# Patient Record
Sex: Female | Born: 1965 | Race: White | Hispanic: No | Marital: Married | Smoking: Former smoker
Health system: Southern US, Community
[De-identification: ages and names within clinical notes are randomized; demographics above are authoritative.]

## PROBLEM LIST (undated history)

## (undated) DIAGNOSIS — J45909 Unspecified asthma, uncomplicated: Secondary | ICD-10-CM

## (undated) DIAGNOSIS — Z973 Presence of spectacles and contact lenses: Secondary | ICD-10-CM

---

## 2001-07-24 HISTORY — PX: DILATION AND CURETTAGE OF UTERUS: SHX78

## 2007-02-07 ENCOUNTER — Ambulatory Visit: Payer: Self-pay | Admitting: Family Medicine

## 2009-10-03 ENCOUNTER — Emergency Department: Payer: Self-pay

## 2009-10-05 ENCOUNTER — Ambulatory Visit: Payer: Self-pay | Admitting: Urology

## 2009-10-07 ENCOUNTER — Ambulatory Visit: Payer: Self-pay | Admitting: Urology

## 2009-10-08 ENCOUNTER — Emergency Department: Payer: Self-pay | Admitting: Emergency Medicine

## 2011-04-05 IMAGING — CT CT ABD-PELV W/ CM
2 of 4 series · 13 of 32 positions shown, 18 images · non-contrast
Comparison: none

REASON FOR EXAM: (1) RLQ pain; (2) see above
COMMENTS:

PROCEDURE:     CT  - CT ABDOMEN / PELVIS  W  - October 03, 2009  [DATE]
RESULT:     CT abdomen and pelvis dated 10/03/2009
TECHNIQUE: Helical 3 mm sections were obtained from the lung bases through
the pubic symphysis status post intravenous administration of 100 mL
Bsovue-3IX and oral contrast. Included are immediate and 2 sets of delayed
imaging status post administration of intravenous contrast, [DATE] is a
10 minute delay.

[Series 2: appendicitis · axial · 0.69mm/px · z∈[-431,-59]mm · 8 of 160 slices shown, 13 images]
[im 18/160  soft-tissue]
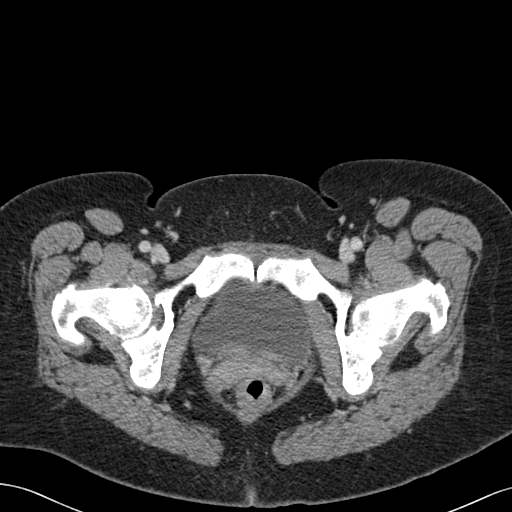
[im 18/160  bone]
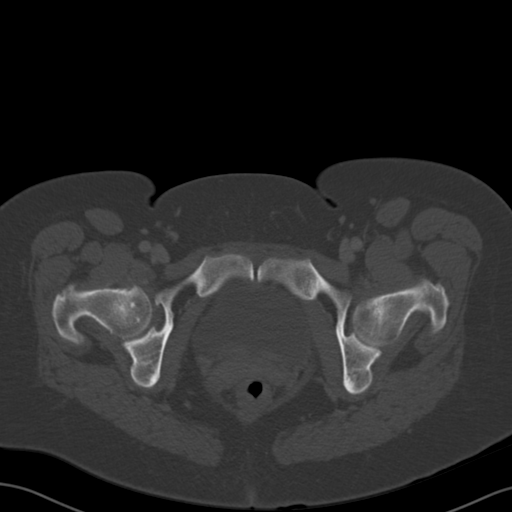
[im 36/160  soft-tissue]
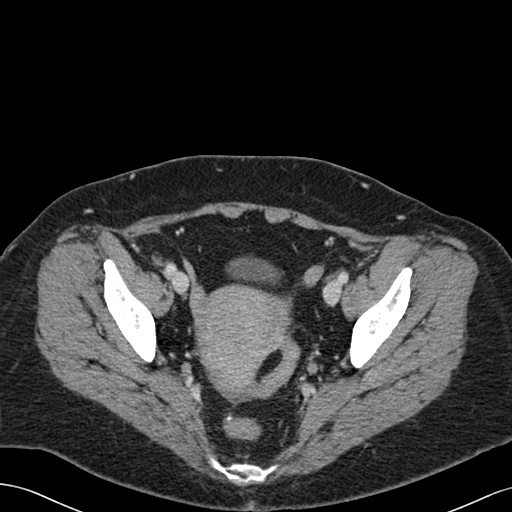
[im 54/160  soft-tissue]
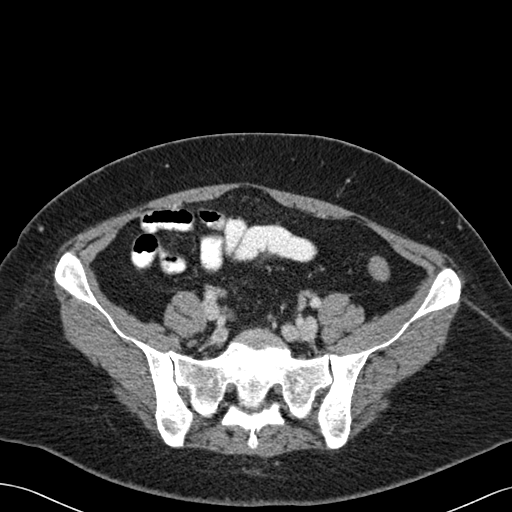
[im 71/160  soft-tissue]
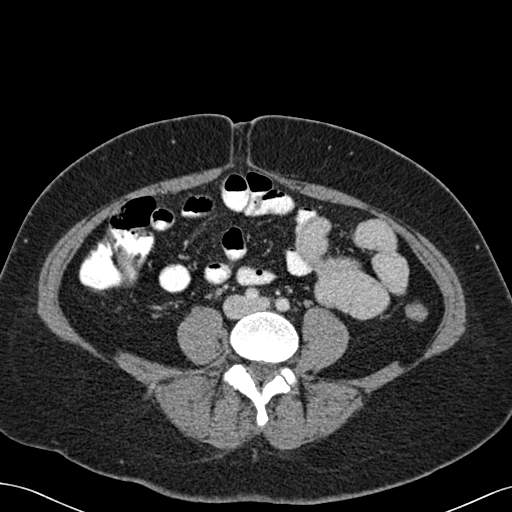
[im 89/160  soft-tissue]
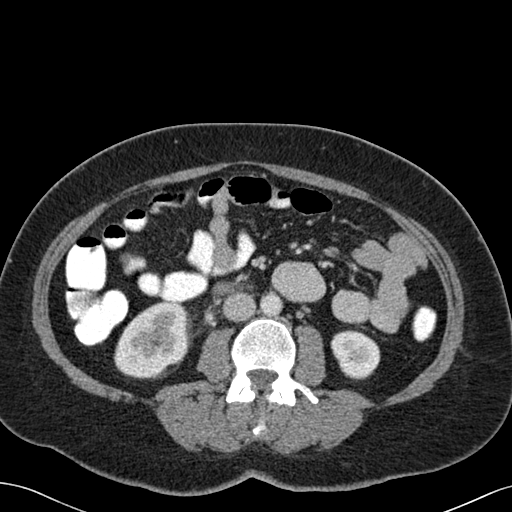
[im 89/160  lung]
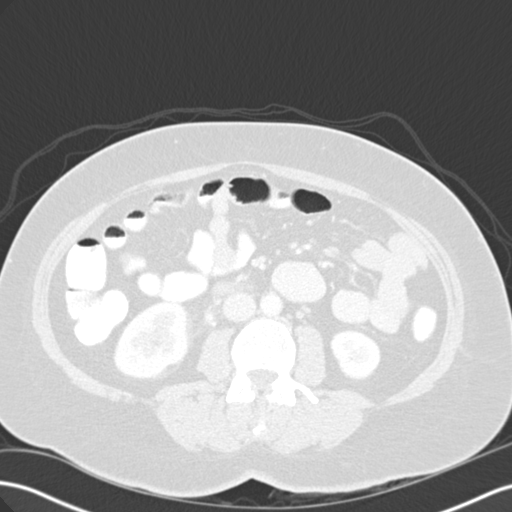
[im 107/160  soft-tissue]
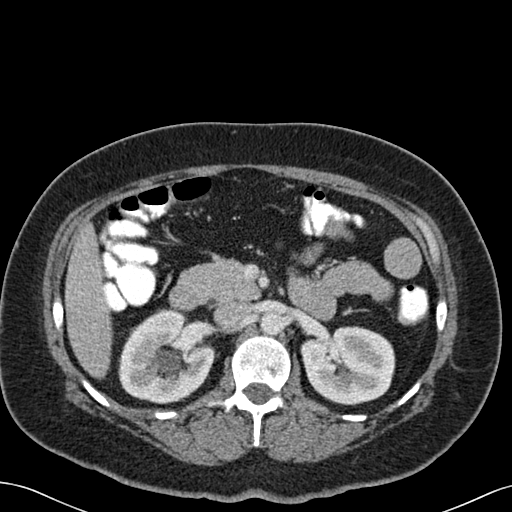
[im 107/160  lung]
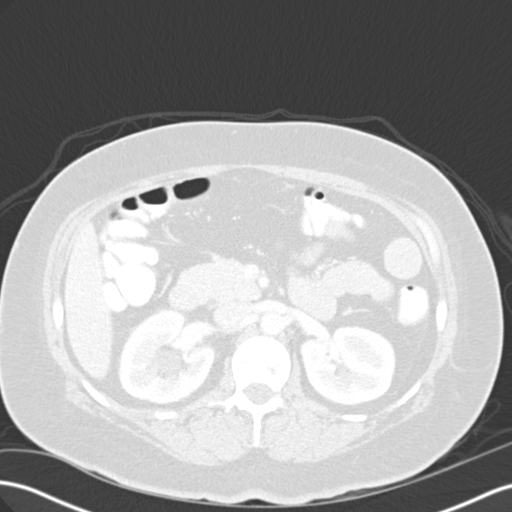
[im 124/160  soft-tissue]
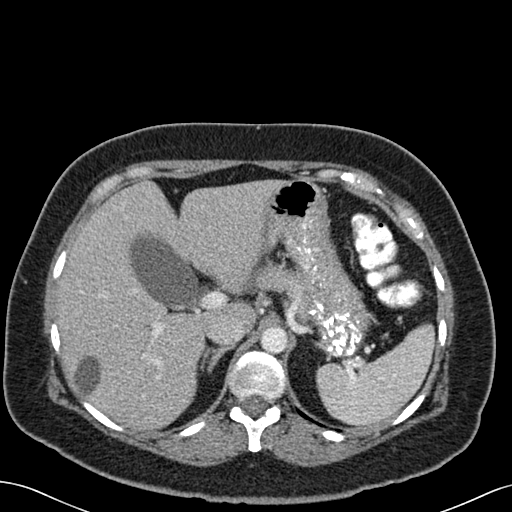
[im 124/160  lung]
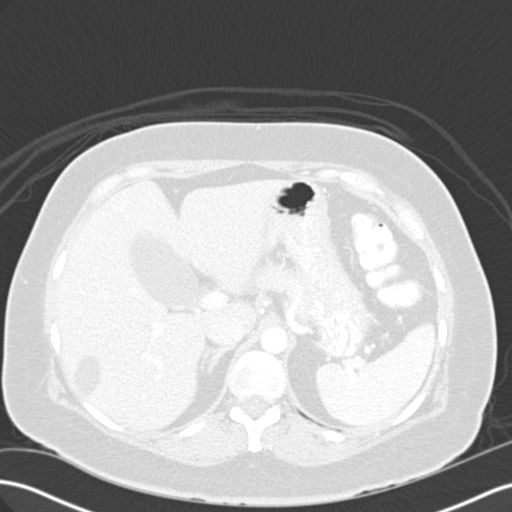
[im 142/160  soft-tissue]
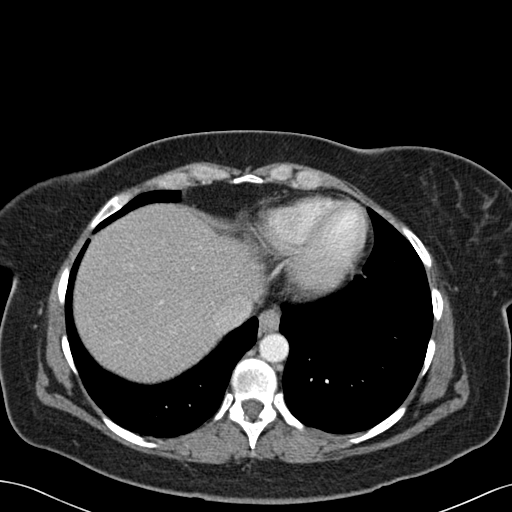
[im 142/160  lung]
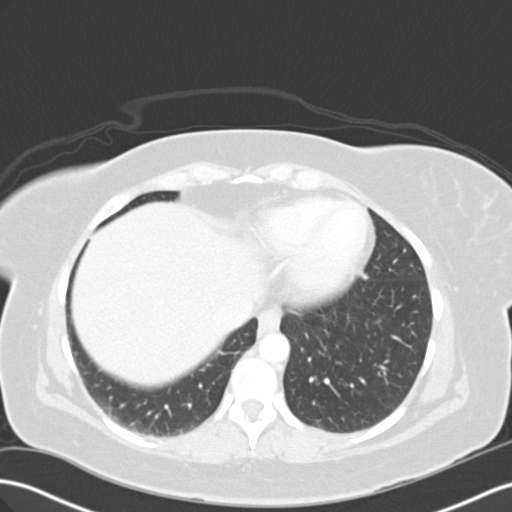

[Series 5: delay · axial · delayed · 0.69mm/px · z∈[-432,-226]mm · 5 of 157 slices shown]
[im 18/157  soft-tissue]
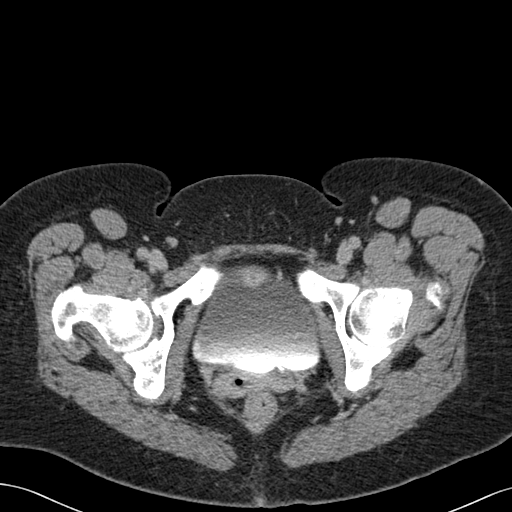
[im 35/157  soft-tissue]
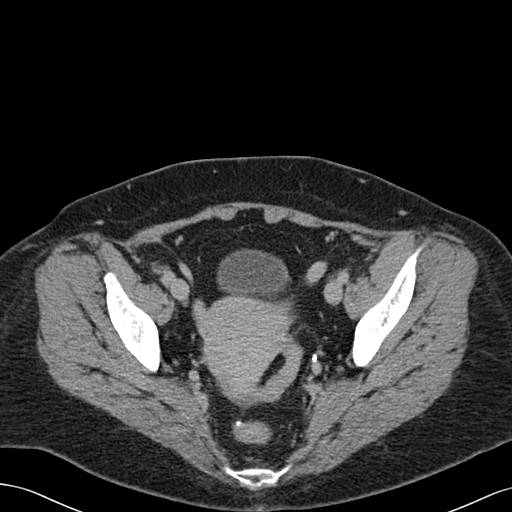
[im 53/157  soft-tissue]
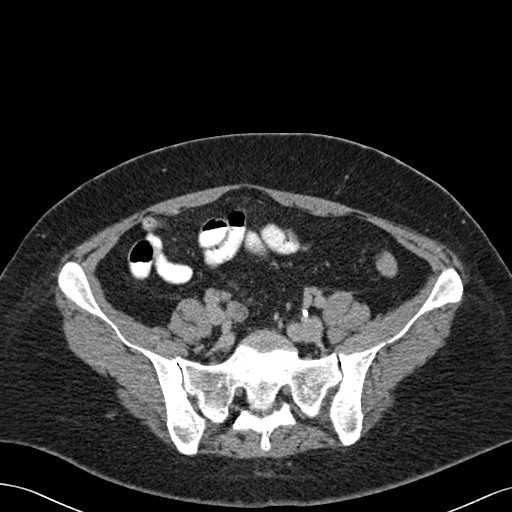
[im 70/157  soft-tissue]
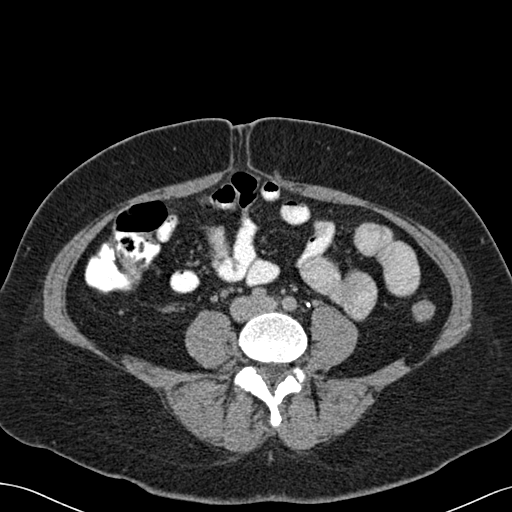
[im 87/157  soft-tissue]
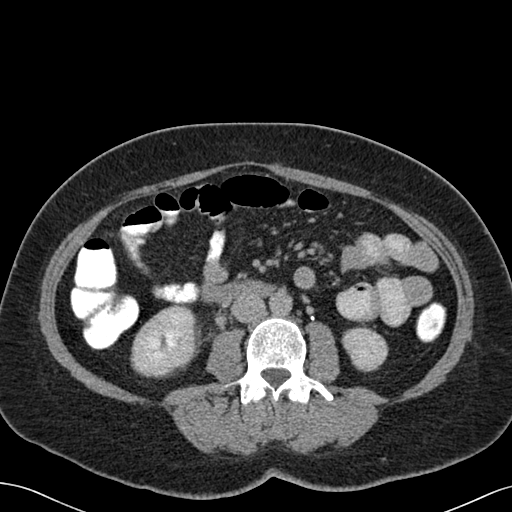

[13 of 32 positions shown; findings below may reference images not displayed]

FINDINGS: Evaluation of the lung bases demonstrates no gross abnormalities.

The liver demonstrates a nonenhancing low attenuating focus within the
posterior aspect of the right lobe of the liver demonstrate Hounsfield units
of 3,2 and 0, respectively, consistent with a cyst. No further liver masses
nor nodules identified. Evaluation of the right kidney demonstrates mild to
moderate hydronephrosis. An 8.7 mm calculus is appreciated within the distal
ureteral pelvis. Contrast appears to traverse the calculus indicative of
partial obstruction. There is no evidence of renal masses. There is no
evidence of hydronephrosis nor calculi within the left urinary collecting
system.

The pancreas, spleen, adrenals are unremarkable. There is no evidence of a
calcified gallstone. There is no CT evidence of bowel obstruction ,
enteritis, colitis, diverticulitis, nor appendicitis. The appendix is
identified, is decompressed and unremarkable. The urinary bladder is
distended which may represent the patient's need to void or possibly a
bladder outlet obstruction.
IMPRESSION: 1. Large calculus within the distal ureteral pelvic junction region on the
right. There is associated mild to moderate obstructive uropathy.
2. No CT evidence of appendicitis.
3. Cysts within the liver
4. No further focal or acute abnormalities.
5. These findings were discussed with Dr. Deeqa Rayaan of the emergency
department at the time of initial interpretation.

## 2013-05-21 ENCOUNTER — Ambulatory Visit: Payer: Self-pay | Admitting: Family Medicine

## 2013-07-24 HISTORY — PX: ABLATION: SHX5711

## 2013-08-08 ENCOUNTER — Ambulatory Visit: Payer: Self-pay | Admitting: Podiatry

## 2013-08-18 ENCOUNTER — Ambulatory Visit: Payer: Self-pay | Admitting: Podiatry

## 2013-08-18 ENCOUNTER — Encounter: Payer: Self-pay | Admitting: Podiatry

## 2013-08-28 ENCOUNTER — Ambulatory Visit (INDEPENDENT_AMBULATORY_CARE_PROVIDER_SITE_OTHER): Payer: Managed Care, Other (non HMO) | Admitting: Podiatry

## 2013-08-28 ENCOUNTER — Ambulatory Visit (INDEPENDENT_AMBULATORY_CARE_PROVIDER_SITE_OTHER): Payer: Managed Care, Other (non HMO)

## 2013-08-28 ENCOUNTER — Encounter: Payer: Self-pay | Admitting: Podiatry

## 2013-08-28 VITALS — BP 127/78 | HR 85 | Resp 16 | Ht 67.0 in | Wt 185.0 lb

## 2013-08-28 DIAGNOSIS — M79609 Pain in unspecified limb: Secondary | ICD-10-CM

## 2013-08-28 DIAGNOSIS — M722 Plantar fascial fibromatosis: Secondary | ICD-10-CM

## 2013-08-28 DIAGNOSIS — M79673 Pain in unspecified foot: Secondary | ICD-10-CM

## 2013-08-28 MED ORDER — METHYLPREDNISOLONE (PAK) 4 MG PO TABS: ORAL_TABLET | ORAL | Status: DC

## 2013-08-28 MED ORDER — MELOXICAM 15 MG PO TABS: 15.0000 mg | ORAL_TABLET | Freq: Every day | ORAL | Status: DC

## 2013-08-28 NOTE — Progress Notes (Signed)
   Subjective:    Patient ID: Ival Bible, female    DOB: [birth year], 48 y.o.   MRN: [id number]  HPI Comments: i have heel pain in my left heel and ive had it for  about 3 months. im trying to wear my tennis shoes more, exercises and wear otc  Inserts. The pain started slowly and its gotten worse and the worse time is in the mornings.  Foot Pain      Review of Systems  All other systems reviewed and are negative.       Objective:   Physical Exam: I have reviewed her past medical history medications allergies surgeries social history and review of systems. Vital signs are stable she is alert and oriented x3. Pulses are strongly palpable right foot. Neurologic sensorium is intact. Deep tendon reflexes are intact bilateral. Muscle strength is 5 over 5 dorsiflexors plantar flexors inverters everters all intrinsic musculature is intact. She has no pain on medial lateral compression of the calcaneus however she does have pain on direct palpation of the medial calcaneal tubercle. Radiographic evaluation demonstrates small plantar distally oriented calcaneal spur with soft tissue increase in density at the plantar fascial calcaneal insertion site. This is indicative of plantar fasciitis. He can use evaluation demonstrates supple well hydrated cutis no erythema edema saline is drainage or odor.        Assessment & Plan:  Assessment: Plantar fasciitis right heel.  Plan: Discussed etiology pathology conservative versus surgical therapies. We injected Kenalog and local anesthetic to the point of maximal tenderness right heel. Plantar fascial brace was dispensed. Night splint was dispensed. A prescription for a Medrol Dosepak to be followed by Mobic was dispensed. Discussed appropriate shoe gear stretching exercises and ice therapy. I will followup with her in one month.

## 2013-08-28 NOTE — Patient Instructions (Signed)
Plantar Fasciitis (Heel Spur Syndrome) with Rehab The plantar fascia is a fibrous, ligament-like, soft-tissue structure that spans the bottom of the foot. Plantar fasciitis is a condition that causes pain in the foot due to inflammation of the tissue. SYMPTOMS   Pain and tenderness on the underneath side of the foot.  Pain that worsens with standing or walking. CAUSES  Plantar fasciitis is caused by irritation and injury to the plantar fascia on the underneath side of the foot. Common mechanisms of injury include:  Direct trauma to bottom of the foot.  Damage to a small nerve that runs under the foot where the main fascia attaches to the heel bone.  Stress placed on the plantar fascia due to bone spurs. RISK INCREASES WITH:   Activities that place stress on the plantar fascia (running, jumping, pivoting, or cutting).  Poor strength and flexibility.  Improperly fitted shoes.  Tight calf muscles.  Flat feet.  Failure to warm-up properly before activity.  Obesity. PREVENTION  Warm up and stretch properly before activity.  Allow for adequate recovery between workouts.  Maintain physical fitness:  Strength, flexibility, and endurance.  Cardiovascular fitness.  Maintain a health body weight.  Avoid stress on the plantar fascia.  Wear properly fitted shoes, including arch supports for individuals who have flat feet. PROGNOSIS  If treated properly, then the symptoms of plantar fasciitis usually resolve without surgery. However, occasionally surgery is necessary. RELATED COMPLICATIONS   Recurrent symptoms that may result in a chronic condition.  Problems of the lower back that are caused by compensating for the injury, such as limping.  Pain or weakness of the foot during push-off following surgery.  Chronic inflammation, scarring, and partial or complete fascia tear, occurring more often from repeated injections. TREATMENT  Treatment initially involves the use of  ice and medication to help reduce pain and inflammation. The use of strengthening and stretching exercises may help reduce pain with activity, especially stretches of the Achilles tendon. These exercises may be performed at home or with a therapist. Your caregiver may recommend that you use heel cups of arch supports to help reduce stress on the plantar fascia. Occasionally, corticosteroid injections are given to reduce inflammation. If symptoms persist for greater than 6 months despite non-surgical (conservative), then surgery may be recommended.  MEDICATION   If pain medication is necessary, then nonsteroidal anti-inflammatory medications, such as aspirin and ibuprofen, or other minor pain relievers, such as acetaminophen, are often recommended.  Do not take pain medication within 7 days before surgery.  Prescription pain relievers may be given if deemed necessary by your caregiver. Use only as directed and only as much as you need.  Corticosteroid injections may be given by your caregiver. These injections should be reserved for the most serious cases, because they may only be given a certain number of times. HEAT AND COLD  Cold treatment (icing) relieves pain and reduces inflammation. Cold treatment should be applied for 10 to 15 minutes every 2 to 3 hours for inflammation and pain and immediately after any activity that aggravates your symptoms. Use ice packs or massage the area with a piece of ice (ice massage).  Heat treatment may be used prior to performing the stretching and strengthening activities prescribed by your caregiver, physical therapist, or athletic trainer. Use a heat pack or soak the injury in warm water. SEEK IMMEDIATE MEDICAL CARE IF:  Treatment seems to offer no benefit, or the condition worsens.  Any medications produce adverse side effects. EXERCISES RANGE   OF MOTION (ROM) AND STRETCHING EXERCISES - Plantar Fasciitis (Heel Spur Syndrome) These exercises may help you  when beginning to rehabilitate your injury. Your symptoms may resolve with or without further involvement from your physician, physical therapist or athletic trainer. While completing these exercises, remember:   Restoring tissue flexibility helps normal motion to return to the joints. This allows healthier, less painful movement and activity.  An effective stretch should be held for at least 30 seconds.  A stretch should never be painful. You should only feel a gentle lengthening or release in the stretched tissue. RANGE OF MOTION - Toe Extension, Flexion  Sit with your right / left leg crossed over your opposite knee.  Grasp your toes and gently pull them back toward the top of your foot. You should feel a stretch on the bottom of your toes and/or foot.  Hold this stretch for __________ seconds.  Now, gently pull your toes toward the bottom of your foot. You should feel a stretch on the top of your toes and or foot.  Hold this stretch for __________ seconds. Repeat __________ times. Complete this stretch __________ times per day.  RANGE OF MOTION - Ankle Dorsiflexion, Active Assisted  Remove shoes and sit on a chair that is preferably not on a carpeted surface.  Place right / left foot under knee. Extend your opposite leg for support.  Keeping your heel down, slide your right / left foot back toward the chair until you feel a stretch at your ankle or calf. If you do not feel a stretch, slide your bottom forward to the edge of the chair, while still keeping your heel down.  Hold this stretch for __________ seconds. Repeat __________ times. Complete this stretch __________ times per day.  STRETCH  Gastroc, Standing  Place hands on wall.  Extend right / left leg, keeping the front knee somewhat bent.  Slightly point your toes inward on your back foot.  Keeping your right / left heel on the floor and your knee straight, shift your weight toward the wall, not allowing your back to  arch.  You should feel a gentle stretch in the right / left calf. Hold this position for __________ seconds. Repeat __________ times. Complete this stretch __________ times per day. STRETCH  Soleus, Standing  Place hands on wall.  Extend right / left leg, keeping the other knee somewhat bent.  Slightly point your toes inward on your back foot.  Keep your right / left heel on the floor, bend your back knee, and slightly shift your weight over the back leg so that you feel a gentle stretch deep in your back calf.  Hold this position for __________ seconds. Repeat __________ times. Complete this stretch __________ times per day. STRETCH  Gastrocsoleus, Standing  Note: This exercise can place a lot of stress on your foot and ankle. Please complete this exercise only if specifically instructed by your caregiver.   Place the ball of your right / left foot on a step, keeping your other foot firmly on the same step.  Hold on to the wall or a rail for balance.  Slowly lift your other foot, allowing your body weight to press your heel down over the edge of the step.  You should feel a stretch in your right / left calf.  Hold this position for __________ seconds.  Repeat this exercise with a slight bend in your right / left knee. Repeat __________ times. Complete this stretch __________ times per day.    STRENGTHENING EXERCISES - Plantar Fasciitis (Heel Spur Syndrome)  These exercises may help you when beginning to rehabilitate your injury. They may resolve your symptoms with or without further involvement from your physician, physical therapist or athletic trainer. While completing these exercises, remember:   Muscles can gain both the endurance and the strength needed for everyday activities through controlled exercises.  Complete these exercises as instructed by your physician, physical therapist or athletic trainer. Progress the resistance and repetitions only as guided. STRENGTH - Towel  Curls  Sit in a chair positioned on a non-carpeted surface.  Place your foot on a towel, keeping your heel on the floor.  Pull the towel toward your heel by only curling your toes. Keep your heel on the floor.  If instructed by your physician, physical therapist or athletic trainer, add ____________________ at the end of the towel. Repeat __________ times. Complete this exercise __________ times per day. STRENGTH - Ankle Inversion  Secure one end of a rubber exercise band/tubing to a fixed object (table, pole). Loop the other end around your foot just before your toes.  Place your fists between your knees. This will focus your strengthening at your ankle.  Slowly, pull your big toe up and in, making sure the band/tubing is positioned to resist the entire motion.  Hold this position for __________ seconds.  Have your muscles resist the band/tubing as it slowly pulls your foot back to the starting position. Repeat __________ times. Complete this exercises __________ times per day.  Document Released: 07/10/2005 Document Revised: 10/02/2011 Document Reviewed: 10/22/2008 [employer] Patient Information 2014 [employer]. Plantar Fasciitis Plantar fasciitis is a common condition that causes foot pain. It is soreness (inflammation) of the band of tough fibrous tissue on the bottom of the foot that runs from the heel bone (calcaneus) to the ball of the foot. The cause of this soreness may be from excessive standing, poor fitting shoes, running on hard surfaces, being overweight, having an abnormal walk, or overuse (this is common in runners) of the painful foot or feet. It is also common in aerobic exercise dancers and ballet dancers. SYMPTOMS  Most people with plantar fasciitis complain of:  Severe pain in the morning on the bottom of their foot especially when taking the first steps out of bed. This pain recedes after a few minutes of walking.  Severe pain is experienced also during walking  following a long period of inactivity.  Pain is worse when walking barefoot or up stairs DIAGNOSIS   Your caregiver will diagnose this condition by examining and feeling your foot.  Special tests such as X-rays of your foot, are usually not needed. PREVENTION   Consult a sports medicine professional before beginning a new exercise program.  Walking programs offer a good workout. With walking there is a lower chance of overuse injuries common to runners. There is less impact and less jarring of the joints.  Begin all new exercise programs slowly. If problems or pain develop, decrease the amount of time or distance until you are at a comfortable level.  Wear good shoes and replace them regularly.  Stretch your foot and the heel cords at the back of the ankle (Achilles tendon) both before and after exercise.  Run or exercise on even surfaces that are not hard. For example, asphalt is better than pavement.  Do not run barefoot on hard surfaces.  If using a treadmill, vary the incline.  Do not continue to workout if you have foot or joint   problems. Seek professional help if they do not improve. HOME CARE INSTRUCTIONS   Avoid activities that cause you pain until you recover.  Use ice or cold packs on the problem or painful areas after working out.  Only take over-the-counter or prescription medicines for pain, discomfort, or fever as directed by your caregiver.  Soft shoe inserts or athletic shoes with air or gel sole cushions may be helpful.  If problems continue or become more severe, consult a sports medicine caregiver or your own health care provider. Cortisone is a potent anti-inflammatory medication that may be injected into the painful area. You can discuss this treatment with your caregiver. MAKE SURE YOU:   Understand these instructions.  Will watch your condition.  Will get help right away if you are not doing well or get worse. Document Released: 04/04/2001 Document  Revised: 10/02/2011 Document Reviewed: 06/03/2008 [employer] Patient Information 2014 [employer].  

## 2013-09-24 ENCOUNTER — Ambulatory Visit: Payer: Managed Care, Other (non HMO) | Admitting: Podiatry

## 2013-10-06 ENCOUNTER — Ambulatory Visit (INDEPENDENT_AMBULATORY_CARE_PROVIDER_SITE_OTHER): Payer: Managed Care, Other (non HMO) | Admitting: Podiatry

## 2013-10-06 VITALS — BP 148/84 | HR 90 | Resp 16 | Ht 67.0 in | Wt 185.0 lb

## 2013-10-06 DIAGNOSIS — M722 Plantar fascial fibromatosis: Secondary | ICD-10-CM

## 2013-10-06 NOTE — Progress Notes (Signed)
She presents today states that her left foot is 90% better. She states that she continues to do all of her conservative therapies at home.  Objective: Vital signs are stable she is alert and oriented x3. Pulses are palpable left. She has no pain on palpation medial continued tubercle the left heel.  Assessment: Resolving plantar fasciitis 90%.  Plan: Discussed etiology pathology conservative versus surgical therapies at this point I encouraged her to continue all conservative therapies until she is completely well. At that point she will continue to treat x1 month.

## 2014-06-02 DIAGNOSIS — N920 Excessive and frequent menstruation with regular cycle: Secondary | ICD-10-CM | POA: Insufficient documentation

## 2014-07-21 ENCOUNTER — Ambulatory Visit: Payer: Self-pay | Admitting: Family Medicine

## 2014-07-23 DIAGNOSIS — Z8669 Personal history of other diseases of the nervous system and sense organs: Secondary | ICD-10-CM | POA: Insufficient documentation

## 2015-01-04 ENCOUNTER — Encounter: Payer: Self-pay | Admitting: Emergency Medicine

## 2015-01-04 ENCOUNTER — Ambulatory Visit
Admission: EM | Admit: 2015-01-04 | Discharge: 2015-01-04 | Disposition: A | Discharge status: Home / Self Care | Payer: Managed Care, Other (non HMO) | Attending: Internal Medicine | Admitting: Internal Medicine

## 2015-01-04 DIAGNOSIS — L0291 Cutaneous abscess, unspecified: Secondary | ICD-10-CM

## 2015-01-04 MED ORDER — NAPROXEN 500 MG PO TABS: 500.0000 mg | ORAL_TABLET | Freq: Two times a day (BID) | ORAL | Status: DC

## 2015-01-04 MED ORDER — SULFAMETHOXAZOLE-TRIMETHOPRIM 800-160 MG PO TABS: 1.0000 | ORAL_TABLET | Freq: Two times a day (BID) | ORAL | Status: DC

## 2015-01-04 MED ORDER — NAPROXEN 500 MG PO TABS: 500.0000 mg | ORAL_TABLET | Freq: Once | ORAL | Status: DC

## 2015-01-04 MED ORDER — MUPIROCIN 2 % EX OINT: 1.0000 "application " | TOPICAL_OINTMENT | Freq: Two times a day (BID) | CUTANEOUS | Status: DC

## 2015-01-04 NOTE — ED Provider Notes (Signed)
CSN: [id number]     Arrival date & time 01/04/15  3664 History   First MD Initiated Contact with Patient 01/04/15 [phone]     Chief Complaint  Patient presents with  . 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  Patient mowed lawn last night noticed some drainage but still hot swollen right armpit came to head needs draining again.  Nausea with headache.  Denied MRSA, stated divot in armpit where last one occurred, has not recently shaved or used deodorant/antiperspirant.  Patient is a 49 y.o. female presenting with abscess. The history is provided by the patient.  Abscess Location:  Shoulder/arm Shoulder/arm abscess location:  R axilla Size:  Quarter Abscess quality: draining, fluctuance, induration, painful, redness, warmth and weeping   Abscess quality: no itching   Red streaking: no   Duration:  6 days Progression:  Worsening Pain details:    Quality:  Throbbing and hot   Severity:  Moderate   Duration:  6 days   Timing:  Constant   Progression:  Worsening Chronicity:  Recurrent Context: not diabetes, not immunosuppression, not injected drug use, not insect bite/sting and not skin injury   Relieved by:  Nothing Worsened by:  Draining/squeezing Ineffective treatments:  Aspirin Associated symptoms: headaches and nausea   Associated symptoms: no anorexia, no fatigue, no fever and no vomiting   Headaches:    Severity:  Mild   Onset quality:  Gradual   Duration:  6 days   Timing:  Intermittent   Progression:  Waxing and waning   Chronicity:  New Nausea:    Severity:  Mild   Onset quality:  Sudden   Duration:  2 days   Timing:  Intermittent   Progression:  Waxing and waning Risk factors: prior abscess   Risk factors: no family hx of MRSA and no hx of MRSA      History reviewed. No pertinent past medical history. History reviewed. No pertinent past surgical history. History reviewed. No pertinent family history. History  Substance Use Topics  . Smoking status: Former Research scientist (life sciences)  . Smokeless tobacco: Never Used  . Alcohol Use: 2.4 oz/week    4 Glasses of wine per week     Comment: occasionally   OB History    No data available     Review of Systems  Constitutional: Negative for fever, chills, diaphoresis, activity change, appetite change and fatigue.  HENT: Negative for dental problem, ear discharge, ear pain, facial swelling, mouth sores, nosebleeds, postnasal drip, rhinorrhea and sinus pressure.   Eyes: Negative for photophobia, pain, discharge, redness, itching and visual disturbance.  Respiratory: Negative for cough, choking, chest tightness, shortness of breath, wheezing and stridor.   Cardiovascular: Negative for chest pain, palpitations and leg swelling.  Gastrointestinal: Positive for nausea. Negative for vomiting, abdominal pain, diarrhea, constipation, blood in stool, abdominal distention, rectal pain and anorexia.  Endocrine: Negative for cold intolerance and heat intolerance.  Genitourinary: Negative for dysuria, enuresis and difficulty urinating.  Musculoskeletal: Positive for myalgias. Negative for back pain, joint swelling, arthralgias, gait problem, neck pain and neck stiffness.  Skin: Positive for rash. Negative for color change, pallor and wound.  Allergic/Immunologic: Negative for environmental allergies and food allergies.  Neurological: Positive for headaches. Negative for dizziness, tremors, seizures, syncope, facial asymmetry, speech difficulty, weakness, light-headedness and numbness.  Hematological: Negative for adenopathy. Does not bruise/bleed  easily.  Psychiatric/Behavioral: Negative for behavioral problems, confusion, sleep disturbance and agitation.    Allergies  Review of patient's allergies indicates no  known allergies.  Home Medications   Prior to Admission medications   Medication Sig Start Date End Date Taking? Authorizing Provider  Melatonin 5 MG TABS Take 5 mg by mouth at bedtime as needed.   Yes Historical Provider, MD  mupirocin ointment (BACTROBAN) 2 % Place 1 application into the nose 2 (two) times daily. To affected 10-14 days 01/04/15   Olen Cordial, NP  naproxen (NAPROSYN) 500 MG tablet Take 1 tablet (500 mg total) by mouth 2 (two) times daily with a meal. 01/04/15   Olen Cordial, NP  sulfamethoxazole-trimethoprim (BACTRIM DS,SEPTRA DS) 800-160 MG per tablet Take 1 tablet by mouth 2 (two) times daily. 01/04/15   Aura Fey Eldred Sooy, NP   BP 131/50 mmHg  Pulse 104  Temp(Src) 98 F (36.7 C) (Oral)  Resp 16  Ht 5\' 7"  (1.702 m)  Wt 185 lb (83.915 kg)  BMI 28.97 kg/m2  SpO2 100% Physical Exam  Constitutional: She is oriented to person, place, and time. Vital signs are normal. She appears well-developed and well-nourished.  HENT:  Head: Normocephalic and atraumatic.  Right Ear: External ear normal.  Left Ear: External ear normal.  Nose: Nose normal.  Mouth/Throat: Oropharynx is clear and moist. No oropharyngeal exudate.  Eyes: Conjunctivae, EOM and lids are normal. Pupils are equal, round, and reactive to light. Right eye exhibits no discharge. Left eye exhibits no discharge. No scleral icterus.  Neck: Trachea normal and normal range of motion. Neck supple. No tracheal deviation present. No thyromegaly present.  Cardiovascular: Normal rate, regular rhythm, normal heart sounds and intact distal pulses.   Pulmonary/Chest: Effort normal and breath sounds normal. No respiratory distress. She has no wheezes.  Abdominal: Soft. Bowel sounds are normal. She exhibits no distension. There is no tenderness.  Musculoskeletal: Normal range of motion. She exhibits no edema or tenderness.       Right shoulder: Normal.       Right elbow: Normal.      Cervical back: Normal.   Lymphadenopathy:    She has no cervical adenopathy.  Neurological: She is alert and oriented to person, place, and time. Coordination normal.  Skin: Skin is warm, dry and intact. Lesion and rash noted. No abrasion, no bruising, no burn, no ecchymosis, no laceration, no petechiae and no purpura noted. Rash is macular and pustular. Rash is not papular, not maculopapular, not nodular, not vesicular and not urticarial. She is not diaphoretic. There is erythema. No cyanosis. No pallor. Nails show no clubbing.     Psychiatric: She has a normal mood and affect. Her speech is normal and behavior is normal. Judgment and thought content normal. Cognition and memory are normal.  Nursing note and vitals reviewed.   ED Course  INCISION AND DRAINAGE Date/Time: 01/04/2015 9:15 AM Performed by: Gerarda Fraction A Authorized by: Sherlene Shams Consent: The procedure was performed in an emergent situation. Verbal consent obtained. Written consent not obtained. Risks and benefits: risks, benefits and alternatives were discussed Consent given by: patient Patient understanding: patient states understanding of the procedure being performed Patient consent: the patient's understanding of the procedure matches consent given Site marked: the operative site was marked Patient identity confirmed: verbally with patient and arm band Type: abscess Body area: upper extremity (right axilla) Anesthesia: local infiltration Local anesthetic: lidocaine 1% without epinephrine Anesthetic total: 3.5 ml Scalpel size: 10  Needle gauge: 22 Incision type: single straight Complexity: complex Drainage: purulent Drainage amount: copious Wound treatment: wound left open Packing material: none Patient tolerance: Patient tolerated the procedure well with no immediate complications Comments: Cleansed skin with povidone iodine and hibiclens prior to lidocaine local infilltration and straight incision drainage of abscess.  Fistula  lateral side abscess appeared/started draining purulent green/yellow opaque malordorous material upon second local injection from distal side abscess for local anesthetic 10cc total 1.5cc lidocaine administered superior and distal total applied pressure to abscess and continued to drain except for 0.5cm area did straight incision to area and 1cc purulent material obtained.  Achieved good local anesthesia with total 3.34ml 1% lidocaine without epinephrine.  Scant bleeding from injection sites halted prior to discharge.  Fluctuance also resolved after incision and drainage.  Triple antibiotic applied along with gauze secured with paper tape.   (including critical care time) Labs Review Labs Reviewed - No data to display  Imaging Review No results found.   MDM   1. Abscess   1. Diagnosis reviewed with patient 2. rx as per orders; risks, benefits, potential side effects reviewed with patient 3. Recommend supportive treatment with warm compresses, bactroban topical BID to affected area, do not scratch affected area and oral antibiotic as prescribed.  Do not submerge affected area/soak in tub/lake/pool/hottub may shower and let soapy water run over affected area.  Discussed to follow up with dermatology or general surgery as second occurrence of abscess in this area. 4. F/u prn if symptoms worsen or don't improve e.g. Red streaks extending past lines greater than 1 inch, joint point, no resolution still having hard area under skin, fever greater than 100.31F, redness doesn't resolve with oral antibiotics last date of administration  Patient verbalized understanding of information/instructions, agreed with plan of care and had no further questions at this time.    Olen Cordial, NP 01/04/15 1017

## 2015-01-04 NOTE — Discharge Instructions (Signed)
Abscess °An abscess is an infected area that contains a collection of pus and debris. It can occur in almost any part of the body. An abscess is also known as a furuncle or boil. °CAUSES  °An abscess occurs when tissue gets infected. This can occur from blockage of oil or sweat glands, infection of hair follicles, or a minor injury to the skin. As the body tries to fight the infection, pus collects in the area and creates pressure under the skin. This pressure causes pain. People with weakened immune systems have difficulty fighting infections and get certain abscesses more often.  °SYMPTOMS °Usually an abscess develops on the skin and becomes a painful mass that is red, warm, and tender. If the abscess forms under the skin, you may feel a moveable soft area under the skin. Some abscesses break open (rupture) on their own, but most will continue to get worse without care. The infection can spread deeper into the body and eventually into the bloodstream, causing you to feel ill.  °DIAGNOSIS  °Your caregiver will take your medical history and perform a physical exam. A sample of fluid may also be taken from the abscess to determine what is causing your infection. °TREATMENT  °Your caregiver may prescribe antibiotic medicines to fight the infection. However, taking antibiotics alone usually does not cure an abscess. Your caregiver may need to make a small cut (incision) in the abscess to drain the pus. In some cases, gauze is packed into the abscess to reduce pain and to continue draining the area. °HOME CARE INSTRUCTIONS  °· Only take over-the-counter or prescription medicines for pain, discomfort, or fever as directed by your caregiver. °· If you were prescribed antibiotics, take them as directed. Finish them even if you start to feel better. °· If gauze is used, follow your caregiver's directions for changing the gauze. °· To avoid spreading the infection: °· Keep your draining abscess covered with a  bandage. °· Wash your hands well. °· Do not share personal care items, towels, or whirlpools with others. °· Avoid skin contact with others. °· Keep your skin and clothes clean around the abscess. °· Keep all follow-up appointments as directed by your caregiver. °SEEK MEDICAL CARE IF:  °· You have increased pain, swelling, redness, fluid drainage, or bleeding. °· You have muscle aches, chills, or a general ill feeling. °· You have a fever. °MAKE SURE YOU:  °· Understand these instructions. °· Will watch your condition. °· Will get help right away if you are not doing well or get worse. °Document Released: 04/19/2005 Document Revised: 01/09/2012 Document Reviewed: 09/22/2011 °[employer]® Patient Information ©2015 [employer]. This information is not intended to replace advice given to you by your health care provider. Make sure you discuss any questions you have with your health care provider. ° °Abscess °Care After °An abscess (also called a boil or furuncle) is an infected area that contains a collection of pus. Signs and symptoms of an abscess include pain, tenderness, redness, or hardness, or you may feel a moveable soft area under your skin. An abscess can occur anywhere in the body. The infection may spread to surrounding tissues causing cellulitis. A cut (incision) by the surgeon was made over your abscess and the pus was drained out. Gauze may have been packed into the space to provide a drain that will allow the cavity to heal from the inside outwards. The boil may be painful for 5 to 7 days. Most people with a boil do not have   high fevers. Your abscess, if seen early, may not have localized, and may not have been lanced. If not, another appointment may be required for this if it does not get better on its own or with medications. °HOME CARE INSTRUCTIONS  °· Only take over-the-counter or prescription medicines for pain, discomfort, or fever as directed by your caregiver. °· When you bathe, soak and then  remove gauze or iodoform packs at least daily or as directed by your caregiver. You may then wash the wound gently with mild soapy water. Repack with gauze or do as your caregiver directs. °SEEK IMMEDIATE MEDICAL CARE IF:  °· You develop increased pain, swelling, redness, drainage, or bleeding in the wound site. °· You develop signs of generalized infection including muscle aches, chills, fever, or a general ill feeling. °· An oral temperature above 102° F (38.9° C) develops, not controlled by medication. °See your caregiver for a recheck if you develop any of the symptoms described above. If medications (antibiotics) were prescribed, take them as directed. °Document Released: 01/26/2005 Document Revised: 10/02/2011 Document Reviewed: 09/23/2007 °[employer]® Patient Information ©2015 [employer]. This information is not intended to replace advice given to you by your health care provider. Make sure you discuss any questions you have with your health care provider. ° °

## 2015-01-04 NOTE — ED Notes (Signed)
Abscess under right arm, she first noticed it 6 days ago and it is progressively getting worse. Drained dark yellow discharge yesterday, no discharge today. No fevers, chills.  Area is painful. Had an abscess like this 8 or 9 years ago.

## 2016-01-24 ENCOUNTER — Ambulatory Visit
Admission: EM | Admit: 2016-01-24 | Discharge: 2016-01-24 | Disposition: A | Discharge status: Home / Self Care | Payer: Managed Care, Other (non HMO) | Attending: Family Medicine | Admitting: Family Medicine

## 2016-01-24 DIAGNOSIS — J209 Acute bronchitis, unspecified: Secondary | ICD-10-CM

## 2016-01-24 MED ORDER — ALBUTEROL SULFATE (2.5 MG/3ML) 0.083% IN NEBU
2.5000 mg | INHALATION_SOLUTION | Freq: Once | RESPIRATORY_TRACT | Status: AC
  Administered 2016-01-24: 2.5 mg via RESPIRATORY_TRACT

## 2016-01-24 MED ORDER — ALBUTEROL SULFATE HFA 108 (90 BASE) MCG/ACT IN AERS: 1.0000 | INHALATION_SPRAY | Freq: Four times a day (QID) | RESPIRATORY_TRACT | Status: DC | PRN

## 2016-01-24 MED ORDER — PREDNISONE 10 MG (21) PO TBPK: 10.0000 mg | ORAL_TABLET | Freq: Every day | ORAL | Status: DC

## 2016-01-24 NOTE — ED Provider Notes (Signed)
CSN: [id number]     Arrival date & time 01/24/16  [id number] History   First MD Initiated Contact with Patient 01/24/16 [phone]     Chief Complaint  Patient presents with  . Cough   (Consider location/radiation/quality/duration/timing/severity/associated sxs/prior Treatment) HPI Comments: Patient presents with cough and tightness in chest for the past 4 days. Started with sore throat and some congestion but now mostly coughing and slight wheezing. Had low grade fever last evening. Has taken Mucinex DM and Tylenol with some relief. No history of chronic lung disease. No other family members ill.   Patient is a 50 y.o. female presenting with cough. The history is provided by the patient.  Cough Cough characteristics:  Non-productive Severity:  Moderate Onset quality:  Sudden Duration:  4 days Timing:  Constant Progression:  Unchanged Chronicity:  New Smoker: no   Context: upper respiratory infection   Relieved by:  Nothing Worsened by:  Deep breathing Ineffective treatments:  Decongestant, rest and cough suppressants Associated symptoms: fever, headaches, shortness of breath and wheezing   Associated symptoms: no rhinorrhea, no sinus congestion and no sore throat   Risk factors: no recent travel     History reviewed. No pertinent past medical history. Past Surgical History  Procedure Laterality Date  . No past surgeries     Family History  Problem Relation Age of Onset  . Heart attack Father   . Liver cancer Mother    Social History  Substance Use Topics  . Smoking status: Former Research scientist (life sciences)  . Smokeless tobacco: Never Used  . Alcohol Use: 2.4 oz/week    4 Glasses of wine per week     Comment: occasionally   OB History    No data available     Review of Systems  Constitutional: Positive for fever.  HENT: Positive for postnasal drip. Negative for rhinorrhea, sinus pressure and sore throat.   Respiratory: Positive for cough, shortness of breath and wheezing.   Neurological:  Positive for headaches.    Allergies  Review of patient's allergies indicates no known allergies.  Home Medications   Prior to Admission medications   Medication Sig Start Date End Date Taking? Authorizing Provider  albuterol (PROVENTIL HFA;VENTOLIN HFA) 108 (90 Base) MCG/ACT inhaler Inhale 1-2 puffs into the lungs every 6 (six) hours as needed for wheezing or shortness of breath. 01/24/16   Katy Apo, NP  Melatonin 5 MG TABS Take 5 mg by mouth at bedtime as needed.    Historical Provider, MD  predniSONE (STERAPRED UNI-PAK 21 TAB) 10 MG (21) TBPK tablet Take 1 tablet (10 mg total) by mouth daily. Take 6 tabs by mouth daily for 1st day. Then decrease by 1 tablet daily until finished. 01/24/16   Katy Apo, NP   Meds Ordered and Administered this Visit   Medications  albuterol (PROVENTIL) (2.5 MG/3ML) 0.083% nebulizer solution 2.5 mg (2.5 mg Nebulization Given 01/24/16 0848)    BP 116/62 mmHg  Pulse 96  Temp(Src) 98.4 F (36.9 C) (Oral)  Resp 16  Ht 5\' 6"  (1.676 m)  Wt 180 lb (81.647 kg)  BMI 29.07 kg/m2  SpO2 98%  LMP 01/21/2016 No data found.   Physical Exam  Constitutional: She is oriented to person, place, and time. She appears well-developed and well-nourished.  HENT:  Head: Normocephalic and atraumatic.  Right Ear: Hearing, tympanic membrane, external ear and ear canal normal.  Left Ear: Hearing, tympanic membrane, external ear and ear canal normal.  Nose: Nose normal. Right sinus exhibits  no maxillary sinus tenderness and no frontal sinus tenderness. Left sinus exhibits no maxillary sinus tenderness and no frontal sinus tenderness.  Mouth/Throat: Uvula is midline and mucous membranes are normal. Posterior oropharyngeal erythema present.  Neck: Normal range of motion. Neck supple.  Cardiovascular: Normal rate, regular rhythm and normal heart sounds.   Pulmonary/Chest: Effort normal. She has decreased breath sounds in the right upper field, the right lower field,  the left upper field and the left lower field. She has wheezes in the right upper field and the left upper field. She has no rhonchi. She has no rales.  Lymphadenopathy:    She has no cervical adenopathy.  Neurological: She is alert and oriented to person, place, and time.  Skin: Skin is warm and dry.    ED Course  Procedures (including critical care time) Patient agrees to try Albuterol nebulizer treatment today to help decrease tightness in chest.   Labs Review Labs Reviewed - No data to display  Imaging Review No results found.   Visual Acuity Review  Right Eye Distance:   Left Eye Distance:   Bilateral Distance:    Right Eye Near:   Left Eye Near:    Bilateral Near:         MDM   1. Acute bronchitis, unspecified organism    Lungs - less wheezing bilaterally after Albuterol nebulizer. Pulse Ox remained 98%. Recommend Albuterol inhaler as directed for any wheezing or coughing. Discussed that bronchitis most likely caused by a virus. May take Prednisone 6 day dose pack as directed. Patient declined any cough medication. Recommend patient follow-up with her PCP if not improving within 4 to 5 days.     Katy Apo, NP 01/24/16 2139

## 2016-01-24 NOTE — ED Notes (Signed)
Patient complains of cough, wheezing, shortness of breath, fevers, chills and bodyaches. Patient states that symptoms started on Thursday and have been progressively getting worse.

## 2016-05-12 ENCOUNTER — Ambulatory Visit (INDEPENDENT_AMBULATORY_CARE_PROVIDER_SITE_OTHER): Payer: Managed Care, Other (non HMO) | Admitting: Family Medicine

## 2016-05-12 ENCOUNTER — Encounter: Payer: Self-pay | Admitting: Family Medicine

## 2016-05-12 VITALS — BP 120/70 | HR 70 | Ht 66.0 in | Wt 187.0 lb

## 2016-05-12 DIAGNOSIS — Z0000 Encounter for general adult medical examination without abnormal findings: Secondary | ICD-10-CM

## 2016-05-12 NOTE — Progress Notes (Signed)
Name: Jacqueline Pham   MRN: [id number]    DOB: [birth year]   Date:05/12/2016       Progress Note  Subjective  Chief Complaint  Chief Complaint  Patient presents with  . Establish Care    no PCP- saw [place] in the past  . Employment Physical    just needs something saying that she had a standard physical for husband's insurance    Patient needs evaluation for insurance purpose.    No problem-specific Assessment & Plan notes found for this encounter.   History reviewed. No pertinent past medical history.  Past Surgical History:  Procedure Laterality Date  . ABLATION     uterine  . NO PAST SURGERIES      Family History  Problem Relation Age of Onset  . Liver cancer Mother   . Cancer Mother   . Heart attack Father   . Cancer Father   . Heart disease Father   . Stroke Maternal Grandmother   . Diabetes Maternal Grandfather   . Heart disease Paternal Grandmother     Social History   Social History  . Marital status: Married    Spouse name: N/A  . Number of children: N/A  . Years of education: N/A   Occupational History  . Not on file.   Social History Main Topics  . Smoking status: Former Research scientist (life sciences)  . Smokeless tobacco: Never Used  . Alcohol use 2.4 oz/week    4 Glasses of wine per week     Comment: occasionally  . Drug use: No  . Sexual activity: Yes   Other Topics Concern  . Not on file   Social History Narrative  . No narrative on file    No Known Allergies   Review of Systems  Constitutional: Negative for chills, fever, malaise/fatigue and weight loss.  HENT: Negative for ear discharge, ear pain and sore throat.   Eyes: Negative for blurred vision.  Respiratory: Negative for cough, sputum production, shortness of breath and wheezing.   Cardiovascular: Negative for chest pain, palpitations and leg swelling.  Gastrointestinal: Negative for abdominal pain, blood in stool, constipation, diarrhea, heartburn, melena and nausea.  Genitourinary:  Negative for dysuria, frequency, hematuria and urgency.  Musculoskeletal: Negative for back pain, joint pain, myalgias and neck pain.  Skin: Negative for rash.  Neurological: Negative for dizziness, tingling, sensory change, focal weakness and headaches.  Endo/Heme/Allergies: Negative for environmental allergies and polydipsia. Does not bruise/bleed easily.  Psychiatric/Behavioral: Negative for depression and suicidal ideas. The patient is not nervous/anxious and does not have insomnia.      Objective  Vitals:   05/12/16 1433  BP: 120/70  Pulse: 70  Weight: 187 lb (84.8 kg)  Height: 5\' 6"  (1.676 m)    Physical Exam  Constitutional: She is well-developed, well-nourished, and in no distress. No distress.  HENT:  Head: Normocephalic and atraumatic.  Right Ear: Tympanic membrane, external ear and ear canal normal.  Left Ear: Tympanic membrane, external ear and ear canal normal.  Nose: Nose normal.  Mouth/Throat: Uvula is midline and oropharynx is clear and moist.  Eyes: Conjunctivae and EOM are normal. Pupils are equal, round, and reactive to light. Right eye exhibits no discharge. Left eye exhibits no discharge.  Neck: Normal range of motion. Neck supple. Normal carotid pulses, no hepatojugular reflux and no JVD present. Carotid bruit is not present. No thyromegaly present.  Cardiovascular: Normal rate, regular rhythm, S1 normal, S2 normal, normal heart sounds, intact distal pulses and normal pulses.  Exam reveals no gallop, no S3, no S4 and no friction rub.   No murmur heard. Pulmonary/Chest: Effort normal and breath sounds normal. She has no wheezes. She has no rales.  Abdominal: Soft. Bowel sounds are normal. She exhibits no mass. There is no tenderness. There is no guarding.  Musculoskeletal: Normal range of motion. She exhibits no edema.  Lymphadenopathy:    She has no cervical adenopathy.  Neurological: She is alert. She has normal sensation, normal strength, normal reflexes  and intact cranial nerves.  Skin: Skin is warm, dry and intact. She is not diaphoretic.  Psychiatric: Mood and affect normal.  Nursing note and vitals reviewed.     Assessment & Plan  Problem List Items Addressed This Visit    None    Visit Diagnoses    Adult general medical exam    -  Primary        Dr. Otilio Miu [facility]  05/12/16

## 2017-02-28 ENCOUNTER — Ambulatory Visit (INDEPENDENT_AMBULATORY_CARE_PROVIDER_SITE_OTHER): Payer: 59 | Admitting: Family Medicine

## 2017-02-28 ENCOUNTER — Encounter: Payer: Self-pay | Admitting: Family Medicine

## 2017-02-28 VITALS — BP 130/80 | HR 80 | Ht 66.0 in | Wt 188.0 lb

## 2017-02-28 DIAGNOSIS — M76891 Other specified enthesopathies of right lower limb, excluding foot: Secondary | ICD-10-CM | POA: Diagnosis not present

## 2017-02-28 DIAGNOSIS — Z0000 Encounter for general adult medical examination without abnormal findings: Secondary | ICD-10-CM | POA: Diagnosis not present

## 2017-02-28 DIAGNOSIS — E663 Overweight: Secondary | ICD-10-CM

## 2017-02-28 NOTE — Patient Instructions (Signed)

## 2017-02-28 NOTE — Progress Notes (Signed)
Name: Jacqueline Pham   MRN: [id number]    DOB: 01-24-1966   Date:02/28/2017       Progress Note  Subjective  Chief Complaint  Chief Complaint  Patient presents with  . Annual Exam    no pap-     Patient presents for annual physical exam.      No problem-specific Assessment & Plan notes found for this encounter.   No past medical history on file.  Past Surgical History:  Procedure Laterality Date  . ABLATION     uterine  . NO PAST SURGERIES      Family History  Problem Relation Age of Onset  . Liver cancer Mother   . Cancer Mother   . Heart attack Father   . Cancer Father   . Heart disease Father   . Stroke Maternal Grandmother   . Diabetes Maternal Grandfather   . Heart disease Paternal Grandmother     Social History   Social History  . Marital status: Married    Spouse name: N/A  . Number of children: N/A  . Years of education: N/A   Occupational History  . Not on file.   Social History Main Topics  . Smoking status: Former Research scientist (life sciences)  . Smokeless tobacco: Never Used  . Alcohol use 2.4 oz/week    4 Glasses of wine per week     Comment: occasionally  . Drug use: No  . Sexual activity: Yes   Other Topics Concern  . Not on file   Social History Narrative  . No narrative on file    No Known Allergies  Outpatient Medications Prior to Visit  Medication Sig Dispense Refill  . albuterol (PROVENTIL HFA;VENTOLIN HFA) 108 (90 Base) MCG/ACT inhaler Inhale 1-2 puffs into the lungs every 6 (six) hours as needed for wheezing or shortness of breath. 1 Inhaler 0  . Biotin 1000 MCG tablet Take 1 tablet by mouth daily.    . Melatonin 5 MG TABS Take 5 mg by mouth at bedtime as needed.     No facility-administered medications prior to visit.     Review of Systems  Constitutional: Negative for chills, diaphoresis, fever, malaise/fatigue and weight loss.  HENT: Negative for congestion, ear discharge, ear pain, hearing loss, nosebleeds, sinus pain, sore throat  and tinnitus.   Eyes: Negative for blurred vision, double vision, photophobia, pain, discharge and redness.  Respiratory: Negative for cough, hemoptysis, sputum production, shortness of breath, wheezing and stridor.   Cardiovascular: Negative for chest pain, palpitations, orthopnea, claudication, leg swelling and PND.  Gastrointestinal: Negative for abdominal pain, blood in stool, constipation, diarrhea, heartburn, melena and nausea.  Genitourinary: Negative for dysuria, frequency, hematuria and urgency.  Musculoskeletal: Negative for back pain, joint pain, myalgias and neck pain.  Skin: Negative for itching and rash.  Neurological: Negative for dizziness, tingling, sensory change, focal weakness, weakness and headaches.  Endo/Heme/Allergies: Negative for environmental allergies and polydipsia. Does not bruise/bleed easily.  Psychiatric/Behavioral: Negative for depression, hallucinations, memory loss, substance abuse and suicidal ideas. The patient is not nervous/anxious and does not have insomnia.      Objective  Vitals:   02/28/17 0935  BP: 130/80  Pulse: 80  Weight: 188 lb (85.3 kg)  Height: 5\' 6"  (1.676 m)    Physical Exam  Constitutional: She is oriented to person, place, and time and well-developed, well-nourished, and in no distress. No distress.  HENT:  Head: Normocephalic and atraumatic.  Right Ear: Tympanic membrane, external ear and ear canal normal.  Left Ear: Tympanic membrane, external ear and ear canal normal.  Nose: Nose normal.  Mouth/Throat: Uvula is midline, oropharynx is clear and moist and mucous membranes are normal. No oropharyngeal exudate, posterior oropharyngeal edema or posterior oropharyngeal erythema.  Eyes: Pupils are equal, round, and reactive to light. Conjunctivae, EOM and lids are normal. Right eye exhibits no discharge. Left eye exhibits no discharge. Right conjunctiva is not injected. Left conjunctiva is not injected.  Fundoscopic exam:      The  right eye shows no arteriolar narrowing, no AV nicking and no papilledema.       The left eye shows no arteriolar narrowing and no AV nicking.  Neck: Trachea normal, normal range of motion, full passive range of motion without pain and phonation normal. Neck supple. Normal carotid pulses, no hepatojugular reflux and no JVD present. Carotid bruit is not present. No thyroid mass and no thyromegaly present.  Cardiovascular: Normal rate, regular rhythm, S1 normal, S2 normal, normal heart sounds, intact distal pulses and normal pulses.  PMI is not displaced.  Exam reveals no gallop, no S3, no S4 and no friction rub.   No murmur heard. Pulmonary/Chest: Effort normal and breath sounds normal. She has no decreased breath sounds. She has no wheezes. She has no rhonchi. She has no rales.  Abdominal: Soft. Normal aorta and bowel sounds are normal. She exhibits no shifting dullness, no distension, no pulsatile liver, no fluid wave, no abdominal bruit, no ascites, no pulsatile midline mass and no mass. There is no hepatosplenomegaly. There is no tenderness. There is no rebound, no guarding and no CVA tenderness.  Musculoskeletal: She exhibits no edema.       Right hip: She exhibits decreased range of motion and tenderness.       Cervical back: Normal.       Thoracic back: Normal.       Lumbar back: Normal.  Lymphadenopathy:       Head (right side): No submental and no submandibular adenopathy present.       Head (left side): No submental adenopathy present.    She has no cervical adenopathy.    She has no axillary adenopathy.  Neurological: She is alert and oriented to person, place, and time. She has normal sensation, normal strength, normal reflexes and intact cranial nerves.  Skin: Skin is warm, dry and intact. No rash noted. She is not diaphoretic.  Psychiatric: Mood, memory, affect and judgment normal.      Assessment & Plan  Problem List Items Addressed This Visit    None    Visit Diagnoses     Annual physical exam    -  Primary   Relevant Orders   Renal Function Panel   Lipid Profile   Overweight       Relevant Orders   Renal Function Panel   Lipid Profile   Hip flexor tendinitis, right          No orders of the defined types were placed in this encounter.     Dr. [facility]  02/28/17

## 2017-03-01 LAB — RENAL FUNCTION PANEL
ALBUMIN: 4.1 g/dL (ref 3.5–5.5)
BUN/Creatinine Ratio: 15 (ref 9–23)
BUN: 9 mg/dL (ref 6–24)
CHLORIDE: 101 mmol/L (ref 96–106)
CO2: 24 mmol/L (ref 20–29)
Calcium: 9.4 mg/dL (ref 8.7–10.2)
Creatinine, Ser: 0.62 mg/dL (ref 0.57–1.00)
GFR calc non Af Amer: 107 mL/min/{1.73_m2} (ref >59)
GFR, EST AFRICAN AMERICAN: 123 mL/min/{1.73_m2} (ref >59)
GLUCOSE: 90 mg/dL (ref 65–99)
POTASSIUM: 4.4 mmol/L (ref 3.5–5.2)
Phosphorus: 2.8 mg/dL (ref 2.5–4.5)
Sodium: 137 mmol/L (ref 134–144)

## 2017-03-01 LAB — LIPID PANEL
Chol/HDL Ratio: 4 ratio (ref 0.0–4.4)
Cholesterol, Total: 253 mg/dL — ABNORMAL HIGH (ref 100–199)
HDL: 63 mg/dL (ref >39)
LDL Calculated: 161 mg/dL — ABNORMAL HIGH (ref 0–99)
Triglycerides: 145 mg/dL (ref 0–149)
VLDL CHOLESTEROL CAL: 29 mg/dL (ref 5–40)

## 2018-02-21 LAB — HM PAP SMEAR: HM Pap smear: NEGATIVE

## 2018-04-01 ENCOUNTER — Other Ambulatory Visit: Payer: Self-pay

## 2018-04-01 ENCOUNTER — Encounter: Payer: Self-pay | Admitting: Emergency Medicine

## 2018-04-01 ENCOUNTER — Ambulatory Visit
Admission: EM | Admit: 2018-04-01 | Discharge: 2018-04-01 | Disposition: A | Discharge status: Home / Self Care | Payer: BC Managed Care – PPO | Attending: Emergency Medicine | Admitting: Emergency Medicine

## 2018-04-01 DIAGNOSIS — M26621 Arthralgia of right temporomandibular joint: Secondary | ICD-10-CM

## 2018-04-01 DIAGNOSIS — H9203 Otalgia, bilateral: Secondary | ICD-10-CM | POA: Diagnosis not present

## 2018-04-01 MED ORDER — CYCLOBENZAPRINE HCL 10 MG PO TABS: 10.0000 mg | ORAL_TABLET | Freq: Every day | ORAL | 0 refills | Status: DC

## 2018-04-01 MED ORDER — IBUPROFEN 600 MG PO TABS: 600.0000 mg | ORAL_TABLET | Freq: Four times a day (QID) | ORAL | 0 refills | Status: DC | PRN

## 2018-04-01 MED ORDER — METHYLPREDNISOLONE 4 MG PO TBPK: ORAL_TABLET | Freq: Every day | ORAL | 0 refills | Status: DC

## 2018-04-01 NOTE — ED Triage Notes (Signed)
Patient c/o right ear pain that started on Friday. Now has left ear pain that started yesterday. Denies swimming. Denies fever.

## 2018-04-01 NOTE — Discharge Instructions (Addendum)
ibuprofen 600 mg take with 1 g of Tylenol together 3 or 4 times a day as needed for pain, methyl prednisone Dosepak, Flexeril to take at night, soft diet for the next several days

## 2018-04-01 NOTE — ED Provider Notes (Signed)
HPI  SUBJECTIVE:  Jacqueline Pham is a 52 y.o. female who presents with 3 days of throbbing, constant, right ear pain, decreased hearing that is worse with chewing and yawning.  She states that she has similar, milder pain starting her left ear this morning.  No otorrhea, fevers, URI symptoms, allergy symptoms.  No recent swimming, or recent flying.  No facial rash, no trauma to the ear, exposure to loud noises.  She tried cleaning her ears out with Debrox, irrigation, candling without improvement in her symptoms.  No alleviating factors.  Symptoms are worse with chewing.  No antibiotics in the past month.  No antipyretic in past 4 to 6 hours.  Past medical history negative for recurrent otitis media, TMJ arthralgia.  LMP: 3 weeks ago.  Denies the possibility of being pregnant.  RJJ:OACZY, Iven Finn, MD   History reviewed. No pertinent past medical history.  Past Surgical History:  Procedure Laterality Date  . ABLATION     uterine  . NO PAST SURGERIES      Family History  Problem Relation Age of Onset  . Liver cancer Mother   . Cancer Mother   . Heart attack Father   . Cancer Father   . Heart disease Father   . Stroke Maternal Grandmother   . Diabetes Maternal Grandfather   . Heart disease Paternal Grandmother     Social History   Tobacco Use  . Smoking status: Former Research scientist (life sciences)  . Smokeless tobacco: Never Used  Substance Use Topics  . Alcohol use: Yes    Alcohol/week: 4.0 standard drinks    Types: 4 Glasses of wine per week    Comment: occasionally  . Drug use: No    No current facility-administered medications for this encounter.   Current Outpatient Medications:  Marland Kitchen  Melatonin 5 MG TABS, Take 5 mg by mouth at bedtime as needed., Disp: , Rfl:  .  cyclobenzaprine (FLEXERIL) 10 MG tablet, Take 1 tablet (10 mg total) by mouth at bedtime., Disp: 20 tablet, Rfl: 0 .  ibuprofen (ADVIL,MOTRIN) 600 MG tablet, Take 1 tablet (600 mg total) by mouth every 6 (six) hours as needed.,  Disp: 30 tablet, Rfl: 0 .  methylPREDNISolone (MEDROL DOSEPAK) 4 MG TBPK tablet, Take by mouth daily. Follow package instructions, Disp: 21 tablet, Rfl: 0  No Known Allergies   ROS  As noted in HPI.   Physical Exam  BP 115/75 (BP Location: Right Arm)   Pulse 75   Temp 98.4 F (36.9 C) (Oral)   Resp 16   Ht 5\' 6"  (1.676 m)   Wt 83.9 kg   LMP 03/11/2018 (Approximate)   SpO2 98%   BMI 29.86 kg/m   Constitutional: Well developed, well nourished, no acute distress Eyes:  EOMI, conjunctiva normal bilaterally HENT: Normocephalic, atraumatic,mucus membranes moist Right external ear normal.  No pain with palpation of mastoid.  Mild pain with traction on pinna, palpation of tragus.  Right external ear canal erythematous, but no abrasion or swelling.  Right TM normal.  Positive tenderness of the right TMJ.  No crepitus.  Left t external ear normal.  No pain with palpation of mastoid.  No pain with traction on pinna, palpation of tragus.  Left external ear canal normal.  Left TM normal.  minimal tenderness of the left TMJ.  No crepitus  Dentition normal. Respiratory: Normal inspiratory effort Cardiovascular: Normal rate GI: nondistended skin: No rash, skin intact Musculoskeletal: no deformities Neurologic: Alert & oriented x 3, no focal  neuro deficits Psychiatric: Speech and behavior appropriate   ED Course   Medications - No data to display  No orders of the defined types were placed in this encounter.   No results found for this or any previous visit (from the past 24 hour(s)). No results found.  ED Clinical Impression  Arthralgia of right temporomandibular joint  Otalgia of both ears   ED Assessment/Plan  Presentation consistent with TMJ arthralgia.  Will send home with ibuprofen 600 mg take with 1 g of Tylenol together 3 or 4 times a day as needed for pain, methyl prednisone Dosepak, Flexeril to take at night, soft diet for the next several days.  While her right  external ear canal is erythematous, do not think that she has an infection at this point in time.  This is most likely from candling/trying to clean her ear.  Follow-up with PMD as needed. Discussed MDM, treatment plan, and plan for follow-up with patient. patient agrees with plan.   Meds ordered this encounter  Medications  . cyclobenzaprine (FLEXERIL) 10 MG tablet    Sig: Take 1 tablet (10 mg total) by mouth at bedtime.    Dispense:  20 tablet    Refill:  0  . methylPREDNISolone (MEDROL DOSEPAK) 4 MG TBPK tablet    Sig: Take by mouth daily. Follow package instructions    Dispense:  21 tablet    Refill:  0  . ibuprofen (ADVIL,MOTRIN) 600 MG tablet    Sig: Take 1 tablet (600 mg total) by mouth every 6 (six) hours as needed.    Dispense:  30 tablet    Refill:  0    *This clinic note was created using [employer]. Therefore, there may be occasional mistakes despite careful proofreading.   ?    Melynda Ripple, MD 04/02/18 [phone]

## 2018-07-05 ENCOUNTER — Other Ambulatory Visit: Payer: Self-pay

## 2018-07-05 ENCOUNTER — Encounter: Payer: Self-pay | Admitting: Emergency Medicine

## 2018-07-05 ENCOUNTER — Ambulatory Visit
Admission: EM | Admit: 2018-07-05 | Discharge: 2018-07-05 | Disposition: A | Discharge status: Home / Self Care | Payer: BC Managed Care – PPO | Attending: Family Medicine | Admitting: Family Medicine

## 2018-07-05 ENCOUNTER — Ambulatory Visit (INDEPENDENT_AMBULATORY_CARE_PROVIDER_SITE_OTHER): Payer: BC Managed Care – PPO

## 2018-07-05 DIAGNOSIS — R05 Cough: Secondary | ICD-10-CM

## 2018-07-05 DIAGNOSIS — R053 Chronic cough: Secondary | ICD-10-CM

## 2018-07-05 MED ORDER — PREDNISONE 50 MG PO TABS: ORAL_TABLET | ORAL | 0 refills | Status: DC

## 2018-07-05 MED ORDER — HYDROCOD POLST-CPM POLST ER 10-8 MG/5ML PO SUER: 5.0000 mL | Freq: Two times a day (BID) | ORAL | 0 refills | Status: DC | PRN

## 2018-07-05 MED ORDER — ALBUTEROL SULFATE HFA 108 (90 BASE) MCG/ACT IN AERS: 1.0000 | INHALATION_SPRAY | Freq: Four times a day (QID) | RESPIRATORY_TRACT | 0 refills | Status: DC | PRN

## 2018-07-05 NOTE — ED Provider Notes (Signed)
[facility]    CSN: [id number] Arrival date & time: 07/05/18  1522  History   Chief Complaint Chief Complaint  Patient presents with  . Cough    HPI   52 year old female presents with cough.  3-week history of cough.  Dry cough.  Reports has had intermittent fever.  Recent fever (Tmax 100).  Cough has not improved with Nyquil and Mucinex. No known exacerbating factors. She reports associated SOB. No other associated symptoms. No other complaints.   PMH, Surgical Hx, Family Hx, Social History reviewed and updated as below. PMH: Patient Active Problem List   Diagnosis Date Noted  . History of migraine 07/23/2014  . Menorrhagia 06/02/2014   Past Surgical History:  Procedure Laterality Date  . ABLATION     uterine  . NO PAST SURGERIES     Family History Family History  Problem Relation Age of Onset  . Liver cancer Mother   . Cancer Mother   . Heart attack Father   . Cancer Father   . Heart disease Father   . Stroke Maternal Grandmother   . Diabetes Maternal Grandfather   . Heart disease Paternal Grandmother     Social History Social History   Tobacco Use  . Smoking status: Former Research scientist (life sciences)  . Smokeless tobacco: Never Used  Substance Use Topics  . Alcohol use: Yes    Alcohol/week: 4.0 standard drinks    Types: 4 Glasses of wine per week    Comment: occasionally  . Drug use: No     Allergies   Patient has no known allergies.   Review of Systems Review of Systems  Constitutional: Positive for fever.  Respiratory: Positive for cough and shortness of breath.    Physical Exam Triage Vital Signs ED Triage Vitals  Enc Vitals Group     BP 07/05/18 1533 116/62     Pulse Rate 07/05/18 1533 82     Resp 07/05/18 1533 16     Temp 07/05/18 1533 98.1 F (36.7 C)     Temp Source 07/05/18 1533 Oral     SpO2 07/05/18 1533 97 %     Weight 07/05/18 1530 182 lb (82.6 kg)     Height 07/05/18 1530 5\' 5"  (1.651 m)     Head Circumference --      Peak  Flow --      Pain Score 07/05/18 1530 0     Pain Loc --      Pain Edu? --      Excl. in [place]? --    Updated Vital Signs BP 116/62 (BP Location: Left Arm)   Pulse 82   Temp 98.1 F (36.7 C) (Oral)   Resp 16   Ht 5\' 5"  (1.651 m)   Wt 82.6 kg   LMP 06/21/2018 (Approximate)   SpO2 97%   BMI 30.29 kg/m   Visual Acuity Right Eye Distance:   Left Eye Distance:   Bilateral Distance:    Right Eye Near:   Left Eye Near:    Bilateral Near:     Physical Exam Vitals signs and nursing note reviewed.  Constitutional:      General: She is not in acute distress.    Appearance: Normal appearance.  HENT:     Head: Normocephalic and atraumatic.     Mouth/Throat:     Pharynx: Oropharynx is clear. No posterior oropharyngeal erythema.  Cardiovascular:     Rate and Rhythm: Normal rate and regular rhythm.  Pulmonary:  Effort: Pulmonary effort is normal. No respiratory distress.     Breath sounds: No wheezing, rhonchi or rales.  Skin:    General: Skin is warm.     Findings: No rash.  Neurological:     Mental Status: She is alert.  Psychiatric:        Mood and Affect: Mood normal.        Behavior: Behavior normal.    UC Treatments / Results  Labs (all labs ordered are listed, but only abnormal results are displayed) Labs Reviewed - No data to display  EKG None  Radiology Dg Chest 2 View  Result Date: 07/05/2018 CLINICAL DATA:  Persistent cough for several weeks EXAM: CHEST - 2 VIEW COMPARISON:  07/21/2014 FINDINGS: The heart size and mediastinal contours are within normal limits. Both lungs are clear. The visualized skeletal structures are unremarkable. IMPRESSION: No active cardiopulmonary disease. Electronically Signed   By: Inez Catalina M.D.   On: 07/05/2018 16:00    Procedures Procedures (including critical care time)  Medications Ordered in UC Medications - No data to display  Initial Impression / Assessment and Plan / UC Course  I have reviewed the triage vital  signs and the nursing notes.  Pertinent labs & imaging results that were available during my care of the patient were reviewed by me and considered in my medical decision making (see chart for details).    52 year old female presents with chronic cough.  Likely postinfectious.  Could have an element of underlying asthma or COPD.  Chest x-ray was normal.  No evidence of infection at this time.  Treating with albuterol, prednisone, Tussionex.  Final Clinical Impressions(s) / UC Diagnoses   Final diagnoses:  Chronic cough     Discharge Instructions     Chest xray was normal. No evidence of infection.  Treating with Albuterol, Prednisone and Tussionex.  Take care  Dr. Lacinda Axon    ED Prescriptions    Medication Sig Dispense Auth. Provider   albuterol (PROVENTIL HFA;VENTOLIN HFA) 108 (90 Base) MCG/ACT inhaler Inhale 1-2 puffs into the lungs every 6 (six) hours as needed for wheezing or shortness of breath. 1 Inhaler Fumio Vandam G, DO   predniSONE (DELTASONE) 50 MG tablet 1 tablet daily x 5 days 5 tablet Javonna Balli G, DO   chlorpheniramine-HYDROcodone (TUSSIONEX PENNKINETIC ER) 10-8 MG/5ML SUER Take 5 mLs by mouth every 12 (twelve) hours as needed. 60 mL Coral Spikes, DO     Controlled Substance Prescriptions  Controlled Substance Registry consulted? Not Applicable   Coral Spikes, DO 07/05/18 1654

## 2018-07-05 NOTE — ED Triage Notes (Signed)
Patient c/o cough and chest congestion for 3 weeks.  Patient reports low grade fever yesterday.

## 2018-07-05 NOTE — Discharge Instructions (Addendum)
Chest xray was normal. No evidence of infection.  Treating with Albuterol, Prednisone and Tussionex.  Take care  Dr. Lacinda Axon

## 2018-07-31 ENCOUNTER — Ambulatory Visit (INDEPENDENT_AMBULATORY_CARE_PROVIDER_SITE_OTHER): Payer: BC Managed Care – PPO | Admitting: Family Medicine

## 2018-07-31 ENCOUNTER — Encounter: Payer: Self-pay | Admitting: Family Medicine

## 2018-07-31 VITALS — BP 100/60 | HR 72 | Ht 65.0 in | Wt 186.0 lb

## 2018-07-31 DIAGNOSIS — Z0000 Encounter for general adult medical examination without abnormal findings: Secondary | ICD-10-CM | POA: Diagnosis not present

## 2018-07-31 DIAGNOSIS — M25551 Pain in right hip: Secondary | ICD-10-CM

## 2018-07-31 DIAGNOSIS — Z1211 Encounter for screening for malignant neoplasm of colon: Secondary | ICD-10-CM

## 2018-07-31 DIAGNOSIS — E782 Mixed hyperlipidemia: Secondary | ICD-10-CM | POA: Diagnosis not present

## 2018-07-31 DIAGNOSIS — M719 Bursopathy, unspecified: Secondary | ICD-10-CM

## 2018-07-31 LAB — HEMOCCULT GUIAC POC 1CARD (OFFICE): Fecal Occult Blood, POC: NEGATIVE

## 2018-07-31 MED ORDER — MELOXICAM 15 MG PO TABS: 15.0000 mg | ORAL_TABLET | Freq: Every day | ORAL | 0 refills | Status: DC

## 2018-07-31 NOTE — Progress Notes (Signed)
Date:  07/31/2018   Name:  Jacqueline Pham   DOB:  1965-12-27   MRN:  [id number]   Chief Complaint: Hip Pain (R) hip pain- hurts when she bends over to tie shoes); Hyperlipidemia; and Colon Cancer Screening (referral to GI for colonoscopy)  Hip Pain   The incident occurred more than 1 week ago. There was no injury mechanism. The pain is present in the right hip. The quality of the pain is described as aching. The pain is at a severity of 4/10. The pain is moderate. The pain has been constant since onset. Associated symptoms include a loss of motion. Pertinent negatives include no inability to bear weight, muscle weakness, numbness or tingling. The symptoms are aggravated by palpation and movement. She has tried nothing for the symptoms.  Hyperlipidemia  This is a chronic problem. The current episode started more than 1 year ago. The problem is controlled. Recent lipid tests were reviewed and are normal. She has no history of chronic renal disease, diabetes, hypothyroidism, liver disease, obesity or nephrotic syndrome. Pertinent negatives include no myalgias or shortness of breath. Current antihyperlipidemic treatment includes diet change. There are no compliance problems.     Review of Systems  Constitutional: Negative.  Negative for chills, fatigue, fever and unexpected weight change.  HENT: Negative for congestion, ear discharge, ear pain, rhinorrhea, sinus pressure, sneezing and sore throat.   Eyes: Negative for photophobia, pain, discharge, redness and itching.  Respiratory: Negative for cough, shortness of breath, wheezing and stridor.   Gastrointestinal: Negative for abdominal pain, blood in stool, constipation, diarrhea, nausea and vomiting.  Endocrine: Negative for cold intolerance, heat intolerance, polydipsia, polyphagia and polyuria.  Genitourinary: Negative for dysuria, flank pain, frequency, hematuria, menstrual problem, pelvic pain, urgency, vaginal bleeding and vaginal  discharge.  Musculoskeletal: Negative for arthralgias, back pain and myalgias.  Skin: Negative for rash.  Allergic/Immunologic: Negative for environmental allergies and food allergies.  Neurological: Negative for dizziness, tingling, weakness, light-headedness, numbness and headaches.  Hematological: Negative for adenopathy. Does not bruise/bleed easily.  Psychiatric/Behavioral: Negative for dysphoric mood. The patient is not nervous/anxious.     Patient Active Problem List   Diagnosis Date Noted  . History of migraine 07/23/2014  . Menorrhagia 06/02/2014    No Known Allergies  Past Surgical History:  Procedure Laterality Date  . ABLATION     uterine  . NO PAST SURGERIES      Social History   Tobacco Use  . Smoking status: Former Research scientist (life sciences)  . Smokeless tobacco: Never Used  Substance Use Topics  . Alcohol use: Yes    Alcohol/week: 4.0 standard drinks    Types: 4 Glasses of wine per week    Comment: occasionally  . Drug use: No     Medication list has been reviewed and updated.  Current Meds  Medication Sig  . albuterol (PROVENTIL HFA;VENTOLIN HFA) 108 (90 Base) MCG/ACT inhaler Inhale 1-2 puffs into the lungs every 6 (six) hours as needed for wheezing or shortness of breath.  . Melatonin 5 MG TABS Take 5 mg by mouth at bedtime as needed.    PHQ 2/9 Scores 02/28/2017  PHQ - 2 Score 0  PHQ- 9 Score 1    Physical Exam Constitutional:      General: She is not in acute distress.    Appearance: She is not diaphoretic.  HENT:     Head: Normocephalic and atraumatic.     Right Ear: Tympanic membrane, ear canal and external ear normal.  Left Ear: Tympanic membrane, ear canal and external ear normal.     Nose: Nose normal.     Mouth/Throat:     Pharynx: Oropharynx is clear. No pharyngeal swelling, oropharyngeal exudate or posterior oropharyngeal erythema.  Eyes:     General:        Right eye: No discharge.        Left eye: No discharge.     Conjunctiva/sclera:  Conjunctivae normal.     Pupils: Pupils are equal, round, and reactive to light.  Neck:     Musculoskeletal: Normal range of motion and neck supple.     Thyroid: No thyromegaly.     Vascular: No JVD.  Cardiovascular:     Rate and Rhythm: Normal rate and regular rhythm.     Heart sounds: Normal heart sounds. No murmur. No friction rub. No gallop.   Pulmonary:     Effort: Pulmonary effort is normal.     Breath sounds: Normal breath sounds.  Abdominal:     General: Bowel sounds are normal.     Palpations: Abdomen is soft. There is no mass.     Tenderness: There is no abdominal tenderness. There is no guarding.  Genitourinary:    Rectum: Normal. Guaiac result negative. No mass or tenderness.  Musculoskeletal: Normal range of motion.     Right hip: She exhibits tenderness.     Lumbar back: She exhibits tenderness.     Comments: Pain with internal/external rotation/tender gluteal bursa/SI joint  Lymphadenopathy:     Cervical: No cervical adenopathy.  Skin:    General: Skin is warm and dry.  Neurological:     Mental Status: She is alert.     Deep Tendon Reflexes: Reflexes are normal and symmetric.     BP 100/60   Pulse 72   Ht 5\' 5"  (1.651 m)   Wt 186 lb (84.4 kg)   LMP 07/30/2018 (Exact Date)   BMI 30.95 kg/m   Assessment and Plan: 1. Moderate mixed hyperlipidemia not requiring statin therapy Chronic.  Stable.  Currently controlled with diet.  Does not take medications very well.  Will recheck lipid panel for evaluation and fasting state. - Renal function panel - Lipid panel  2. Pain of right hip joint Patient has persistence of right hip pain which is painful with internal/external rotation will initiate meloxicam 15 mg 1 a day.  Referral made to orthopedic surgery for evaluation. - Ambulatory referral to Orthopedic Surgery  3. Colon cancer screening Cancer screen noted that guaiac was negative.  Furl made to gastroenterology for colonoscopy to evaluate for colon cancer  screening. - Ambulatory referral to Gastroenterology - POCT occult blood stool  4. Healthcare maintenance Lab work was done for renal panel and lipids. - Renal function panel  5. Bursitis of other site Tenderness on the lateral aspect right greater trochanter be consistent with bursitis referral was made to orthopedics for evaluation. - Ambulatory referral to Orthopedic Surgery

## 2018-08-01 LAB — RENAL FUNCTION PANEL
Albumin: 4.4 g/dL (ref 3.5–5.5)
BUN / CREAT RATIO: 22 (ref 9–23)
BUN: 13 mg/dL (ref 6–24)
CALCIUM: 9.3 mg/dL (ref 8.7–10.2)
CO2: 23 mmol/L (ref 20–29)
CREATININE: 0.59 mg/dL (ref 0.57–1.00)
Chloride: 100 mmol/L (ref 96–106)
GFR calc Af Amer: 124 mL/min/{1.73_m2} (ref >59)
GFR calc non Af Amer: 107 mL/min/{1.73_m2} (ref >59)
Glucose: 87 mg/dL (ref 65–99)
Phosphorus: 3 mg/dL (ref 2.5–4.5)
Potassium: 4.3 mmol/L (ref 3.5–5.2)
Sodium: 137 mmol/L (ref 134–144)

## 2018-08-01 LAB — LIPID PANEL
CHOLESTEROL TOTAL: 228 mg/dL — AB (ref 100–199)
Chol/HDL Ratio: 3.9 ratio (ref 0.0–4.4)
HDL: 59 mg/dL (ref >39)
LDL CALC: 148 mg/dL — AB (ref 0–99)
Triglycerides: 107 mg/dL (ref 0–149)
VLDL CHOLESTEROL CAL: 21 mg/dL (ref 5–40)

## 2018-08-12 ENCOUNTER — Encounter: Payer: Self-pay | Admitting: *Deleted

## 2018-08-13 ENCOUNTER — Other Ambulatory Visit: Payer: Self-pay

## 2018-08-13 ENCOUNTER — Telehealth: Payer: Self-pay

## 2018-08-13 DIAGNOSIS — Z1211 Encounter for screening for malignant neoplasm of colon: Secondary | ICD-10-CM

## 2018-08-13 NOTE — Telephone Encounter (Signed)
Pt is calling to schedule a colonoscopy  °

## 2018-08-13 NOTE — Telephone Encounter (Signed)
Patients call has been returned.  She has been scheduled for colonoscopy with Dr. Allen Norris at [facility] on 08/30/18.  Thanks [employer]

## 2018-08-26 ENCOUNTER — Encounter: Payer: Self-pay | Admitting: *Deleted

## 2018-08-26 ENCOUNTER — Other Ambulatory Visit: Payer: Self-pay

## 2018-08-29 NOTE — Discharge Instructions (Signed)
General Anesthesia, Adult, Care After  This sheet gives you information about how to care for yourself after your procedure. Your health care provider may also give you more specific instructions. If you have problems or questions, contact your health care provider.  What can I expect after the procedure?  After the procedure, the following side effects are common:  Pain or discomfort at the IV site.  Nausea.  Vomiting.  Sore throat.  Trouble concentrating.  Feeling cold or chills.  Weak or tired.  Sleepiness and fatigue.  Soreness and body aches. These side effects can affect parts of the body that were not involved in surgery.  Follow these instructions at home:    For at least 24 hours after the procedure:  Have a responsible adult stay with you. It is important to have someone help care for you until you are awake and alert.  Rest as needed.  Do not:  Participate in activities in which you could fall or become injured.  Drive.  Use heavy machinery.  Drink alcohol.  Take sleeping pills or medicines that cause drowsiness.  Make important decisions or sign legal documents.  Take care of children on your own.  Eating and drinking  Follow any instructions from your health care provider about eating or drinking restrictions.  When you feel hungry, start by eating small amounts of foods that are soft and easy to digest (bland), such as toast. Gradually return to your regular diet.  Drink enough fluid to keep your urine pale yellow.  If you vomit, rehydrate by drinking water, juice, or clear broth.  General instructions  If you have sleep apnea, surgery and certain medicines can increase your risk for breathing problems. Follow instructions from your health care provider about wearing your sleep device:  Anytime you are sleeping, including during daytime naps.  While taking prescription pain medicines, sleeping medicines, or medicines that make you drowsy.  Return to your normal activities as told by your health care  provider. Ask your health care provider what activities are safe for you.  Take over-the-counter and prescription medicines only as told by your health care provider.  If you smoke, do not smoke without supervision.  Keep all follow-up visits as told by your health care provider. This is important.  Contact a health care provider if:  You have nausea or vomiting that does not get better with medicine.  You cannot eat or drink without vomiting.  You have pain that does not get better with medicine.  You are unable to pass urine.  You develop a skin rash.  You have a fever.  You have redness around your IV site that gets worse.  Get help right away if:  You have difficulty breathing.  You have chest pain.  You have blood in your urine or stool, or you vomit blood.  Summary  After the procedure, it is common to have a sore throat or nausea. It is also common to feel tired.  Have a responsible adult stay with you for the first 24 hours after general anesthesia. It is important to have someone help care for you until you are awake and alert.  When you feel hungry, start by eating small amounts of foods that are soft and easy to digest (bland), such as toast. Gradually return to your regular diet.  Drink enough fluid to keep your urine pale yellow.  Return to your normal activities as told by your health care provider. Ask your health care   provider what activities are safe for you.  This information is not intended to replace advice given to you by your health care provider. Make sure you discuss any questions you have with your health care provider.  Document Released: 10/16/2000 Document Revised: 02/23/2017 Document Reviewed: 02/23/2017  Elsevier Interactive Patient Education  2019 [employer]vier Inc[employer]

## 2018-08-30 ENCOUNTER — Ambulatory Visit: Payer: BC Managed Care – PPO | Admitting: Anesthesiology

## 2018-08-30 ENCOUNTER — Ambulatory Visit
Admission: RE | Admit: 2018-08-30 | Discharge: 2018-08-30 | Disposition: A | Discharge status: Home / Self Care | Payer: BC Managed Care – PPO | Source: Ambulatory Visit | Attending: Gastroenterology | Admitting: Gastroenterology

## 2018-08-30 ENCOUNTER — Encounter
Admission: RE | Disposition: A | Discharge status: Home / Self Care | Payer: Self-pay | Source: Ambulatory Visit | Attending: Gastroenterology

## 2018-08-30 DIAGNOSIS — Z1211 Encounter for screening for malignant neoplasm of colon: Secondary | ICD-10-CM | POA: Diagnosis present

## 2018-08-30 DIAGNOSIS — Z6830 Body mass index (BMI) 30.0-30.9, adult: Secondary | ICD-10-CM | POA: Insufficient documentation

## 2018-08-30 DIAGNOSIS — Z8249 Family history of ischemic heart disease and other diseases of the circulatory system: Secondary | ICD-10-CM | POA: Insufficient documentation

## 2018-08-30 DIAGNOSIS — Z7951 Long term (current) use of inhaled steroids: Secondary | ICD-10-CM | POA: Insufficient documentation

## 2018-08-30 DIAGNOSIS — Z809 Family history of malignant neoplasm, unspecified: Secondary | ICD-10-CM | POA: Insufficient documentation

## 2018-08-30 DIAGNOSIS — Z823 Family history of stroke: Secondary | ICD-10-CM | POA: Insufficient documentation

## 2018-08-30 DIAGNOSIS — Z79899 Other long term (current) drug therapy: Secondary | ICD-10-CM | POA: Insufficient documentation

## 2018-08-30 DIAGNOSIS — Z87891 Personal history of nicotine dependence: Secondary | ICD-10-CM | POA: Diagnosis not present

## 2018-08-30 DIAGNOSIS — D120 Benign neoplasm of cecum: Secondary | ICD-10-CM | POA: Diagnosis not present

## 2018-08-30 DIAGNOSIS — E669 Obesity, unspecified: Secondary | ICD-10-CM | POA: Insufficient documentation

## 2018-08-30 DIAGNOSIS — Z833 Family history of diabetes mellitus: Secondary | ICD-10-CM | POA: Insufficient documentation

## 2018-08-30 DIAGNOSIS — J45909 Unspecified asthma, uncomplicated: Secondary | ICD-10-CM | POA: Diagnosis not present

## 2018-08-30 DIAGNOSIS — Z791 Long term (current) use of non-steroidal anti-inflammatories (NSAID): Secondary | ICD-10-CM | POA: Insufficient documentation

## 2018-08-30 DIAGNOSIS — Z800 Family history of malignant neoplasm of digestive organs: Secondary | ICD-10-CM | POA: Diagnosis not present

## 2018-08-30 HISTORY — DX: Presence of spectacles and contact lenses: Z97.3

## 2018-08-30 HISTORY — DX: Unspecified asthma, uncomplicated: J45.909

## 2018-08-30 HISTORY — PX: COLONOSCOPY WITH PROPOFOL: SHX5780

## 2018-08-30 HISTORY — PX: POLYPECTOMY: SHX5525

## 2018-08-30 LAB — POCT PREGNANCY, URINE: Preg Test, Ur: NEGATIVE

## 2018-08-30 SURGERY — COLONOSCOPY WITH PROPOFOL
Anesthesia: General | Site: Rectum

## 2018-08-30 MED ORDER — STERILE WATER FOR IRRIGATION IR SOLN
Status: DC | PRN
  Administered 2018-08-30: .05 mL

## 2018-08-30 MED ORDER — LACTATED RINGERS IV SOLN
INTRAVENOUS | Status: DC
  Administered 2018-08-30: 08:00:00 via INTRAVENOUS

## 2018-08-30 MED ORDER — PROPOFOL 10 MG/ML IV BOLUS
INTRAVENOUS | Status: DC | PRN
  Administered 2018-08-30: 20 mg via INTRAVENOUS
  Administered 2018-08-30: 40 mg via INTRAVENOUS
  Administered 2018-08-30: 100 mg via INTRAVENOUS
  Administered 2018-08-30 (×2): 20 mg via INTRAVENOUS
  Administered 2018-08-30: 40 mg via INTRAVENOUS
  Administered 2018-08-30: 30 mg via INTRAVENOUS
  Administered 2018-08-30 (×4): 20 mg via INTRAVENOUS

## 2018-08-30 MED ORDER — SODIUM CHLORIDE 0.9 % IV SOLN: INTRAVENOUS | Status: DC

## 2018-08-30 MED ORDER — LACTATED RINGERS IV SOLN
INTRAVENOUS | Status: DC | PRN
  Administered 2018-08-30: 09:00:00 via INTRAVENOUS

## 2018-08-30 MED ORDER — LIDOCAINE HCL (CARDIAC) PF 100 MG/5ML IV SOSY
PREFILLED_SYRINGE | INTRAVENOUS | Status: DC | PRN
  Administered 2018-08-30: 40 mg via INTRAVENOUS

## 2018-08-30 MED ORDER — ONDANSETRON HCL 4 MG/2ML IJ SOLN: 4.0000 mg | Freq: Once | INTRAMUSCULAR | Status: DC | PRN

## 2018-08-30 SURGICAL SUPPLY — 10 items
CANISTER SUCT 1200ML W/VALVE (MISCELLANEOUS) ×3 IMPLANT
CLIP HMST 235XBRD CATH ROT (MISCELLANEOUS) IMPLANT
CLIP RESOLUTION 360 11X235 (MISCELLANEOUS) ×1
FORCEPS BIOP RAD 4 LRG CAP 4 (CUTTING FORCEPS) ×1 IMPLANT
GOWN CVR UNV OPN BCK APRN NK (MISCELLANEOUS) ×4 IMPLANT
GOWN ISOL THUMB LOOP REG UNIV (MISCELLANEOUS) ×2
KIT ENDO PROCEDURE OLY (KITS) ×3 IMPLANT
SNARE SHORT THROW 13M SML OVAL (MISCELLANEOUS) ×1 IMPLANT
TRAP ETRAP POLY (MISCELLANEOUS) ×1 IMPLANT
WATER STERILE IRR 250ML POUR (IV SOLUTION) ×3 IMPLANT

## 2018-08-30 NOTE — Anesthesia Preprocedure Evaluation (Signed)
Anesthesia Evaluation  Patient identified by MRN, date of birth, ID band Patient awake    Reviewed: Allergy & Precautions, NPO status , Patient's Chart, lab work & pertinent test results  Airway Mallampati: II  TM Distance: >3 FB     Dental   Pulmonary asthma , former smoker,    breath sounds clear to auscultation       Cardiovascular negative cardio ROS   Rhythm:Regular Rate:Normal     Neuro/Psych    GI/Hepatic negative GI ROS,   Endo/Other  Obesity - BMI 31  Renal/GU      Musculoskeletal   Abdominal   Peds  Hematology   Anesthesia Other Findings   Reproductive/Obstetrics                             Anesthesia Physical Anesthesia Plan  ASA: II  Anesthesia Plan: General   Post-op Pain Management:    Induction: Intravenous  PONV Risk Score and Plan: Propofol infusion  Airway Management Planned: Nasal Cannula and Natural Airway  Additional Equipment:   Intra-op Plan:   Post-operative Plan:   Informed Consent: I have reviewed the patients History and Physical, chart, labs and discussed the procedure including the risks, benefits and alternatives for the proposed anesthesia with the patient or authorized representative who has indicated his/her understanding and acceptance.       Plan Discussed with: CRNA  Anesthesia Plan Comments:         Anesthesia Quick Evaluation

## 2018-08-30 NOTE — Anesthesia Procedure Notes (Signed)
Procedure Name: MAC Date/Time: 08/30/2018 8:35 AM Performed by: Janna Arch, CRNA Pre-anesthesia Checklist: Patient identified, Emergency Drugs available, Suction available, Timeout performed and Patient being monitored Patient Re-evaluated:Patient Re-evaluated prior to induction Oxygen Delivery Method: Nasal cannula Placement Confirmation: positive ETCO2

## 2018-08-30 NOTE — Transfer of Care (Signed)
Immediate Anesthesia Transfer of Care Note  Patient: Jacqueline Pham  Procedure(s) Performed: COLONOSCOPY WITH PROPOFOL (N/A ) POLYPECTOMY (Rectum)  Patient Location: PACU  Anesthesia Type: General  Level of Consciousness: awake, alert  and patient cooperative  Airway and Oxygen Therapy: Patient Spontanous Breathing and Patient connected to supplemental oxygen  Post-op Assessment: Post-op Vital signs reviewed, Patient's Cardiovascular Status Stable, Respiratory Function Stable, Patent Airway and No signs of Nausea or vomiting  Post-op Vital Signs: Reviewed and stable  Complications: No apparent anesthesia complications

## 2018-08-30 NOTE — Op Note (Signed)
[facility] Gastroenterology Patient Name: Jacqueline Pham Procedure Date: 08/30/2018 8:26 AM MRN: [id number] Account #: [id number] Date of Birth: 1966/06/28 Admit Type: Outpatient Age: 53 Room: [facility] OR ROOM 01 Gender: Female Note Status: Finalized Procedure:            Colonoscopy Indications:          Screening for colorectal malignant neoplasm Providers:            [name] MD, MD Referring MD:         Juline Patch, MD (Referring MD) Medicines:            Propofol per Anesthesia Complications:        No immediate complications. Procedure:            Pre-Anesthesia Assessment:                       - Prior to the procedure, a History and Physical was                        performed, and patient medications and allergies were                        reviewed. The patient's tolerance of previous                        anesthesia was also reviewed. The risks and benefits of                        the procedure and the sedation options and risks were                        discussed with the patient. All questions were                        answered, and informed consent was obtained. Prior                        Anticoagulants: The patient has taken no previous                        anticoagulant or antiplatelet agents. ASA Grade                        Assessment: II - A patient with mild systemic disease.                        After reviewing the risks and benefits, the patient was                        deemed in satisfactory condition to undergo the                        procedure.                       After obtaining informed consent, the colonoscope was                        passed under direct vision. Throughout the procedure,  the patient's blood pressure, pulse, and oxygen                        saturations were monitored continuously. The was                        introduced through the anus and advanced to the the               cecum, identified by appendiceal orifice and ileocecal                        valve. The colonoscopy was performed without                        difficulty. The patient tolerated the procedure well.                        The quality of the bowel preparation was excellent. Findings:      The perianal and digital rectal examinations were normal.      A 1 mm polyp was found in the cecum. The polyp was sessile. The polyp       was removed with a cold biopsy forceps. Resection and retrieval were       complete.      A 5 mm polyp was found in the cecum. The polyp was sessile. The polyp       was removed with a cold snare. Resection and retrieval were complete.      A 6 mm polyp was found in the cecum. The polyp was sessile. The polyp       was removed with a cold snare. Resection and retrieval were complete. To       prevent bleeding post-intervention, one hemostatic clip was successfully       placed (MR conditional). There was no bleeding at the end of the       procedure. Impression:           - One 1 mm polyp in the cecum, removed with a cold                        biopsy forceps. Resected and retrieved.                       - One 5 mm polyp in the cecum, removed with a cold                        snare. Resected and retrieved.                       - One 6 mm polyp in the cecum, removed with a cold                        snare. Resected and retrieved. Clip (MR conditional)                        was placed. Recommendation:       - Discharge patient to home.                       - Resume previous diet.                       -  Continue present medications.                       - Await pathology results.                       - Repeat colonoscopy in 5 years if polyp adenoma and 10                        years if hyperplastic Procedure Code(s):    --- Professional ---                       [phone], Colonoscopy, flexible; with removal of tumor(s),                         polyp(s), or other lesion(s) by snare technique                       [id number], 74, Colonoscopy, flexible; with biopsy, single                        or multiple Diagnosis Code(s):    --- Professional ---                       Z12.11, Encounter for screening for malignant neoplasm                        of colon                       D12.0, Benign neoplasm of cecum CPT copyright 2018 American Medical Association. All rights reserved. The codes documented in this report are preliminary and upon coder review may  be revised to meet current compliance requirements. [name] MD, MD 08/30/2018 9:00:01 AM This report has been signed electronically. Number of Addenda: 0 Note Initiated On: 08/30/2018 8:26 AM Scope Withdrawal Time: 0 hours 14 minutes 0 seconds  Total Procedure Duration: 0 hours 17 minutes 4 seconds       [facility]

## 2018-08-30 NOTE — H&P (Signed)
[name], MD [address] [place], [place] [address] Phone: [phone] Fax : [phone]  Primary Care Physician:  Juline Patch, MD Primary Gastroenterologist:  Dr. Allen Norris  Pre-Procedure History & Physical: HPI:  Jacqueline Pham is a 53 y.o. female is here for a screening colonoscopy.   Past Medical History:  Diagnosis Date  . Asthma   . Wears contact lenses     Past Surgical History:  Procedure Laterality Date  . ABLATION  2015   uterine  . DILATION AND CURETTAGE OF UTERUS  2003    Prior to Admission medications   Medication Sig Start Date End Date Taking? Authorizing Provider  albuterol (PROVENTIL HFA;VENTOLIN HFA) 108 (90 Base) MCG/ACT inhaler Inhale 1-2 puffs into the lungs every 6 (six) hours as needed for wheezing or shortness of breath. 07/05/18  Yes Cook, Jayce G, DO  Cyanocobalamin (VITAMIN B-12 PO) Take by mouth daily.   Yes [provider]  Melatonin 5 MG TABS Take 5 mg by mouth at bedtime as needed.   Yes [provider]  meloxicam (MOBIC) 15 MG tablet Take 1 tablet (15 mg total) by mouth daily. 07/31/18  Yes Juline Patch, MD  Multiple Vitamin (MULTIVITAMIN) tablet Take 1 tablet by mouth daily.   Yes [provider]  Omega-3 Fatty Acids (FISH OIL PO) Take by mouth daily.   Yes [provider]  ibuprofen (ADVIL,MOTRIN) 600 MG tablet Take 1 tablet (600 mg total) by mouth every 6 (six) hours as needed. Patient not taking: Reported on 07/31/2018 04/01/18   Melynda Ripple, MD    Allergies as of 08/13/2018  . (No Known Allergies)    Family History  Problem Relation Age of Onset  . Liver cancer Mother   . Cancer Mother   . Heart attack Father   . Cancer Father   . Heart disease Father   . Stroke Maternal Grandmother   . Diabetes Maternal Grandfather   . Heart disease Paternal Grandmother     Social History   Socioeconomic History  . Marital status: Married    Spouse name: Not on file  .  Number of children: Not on file  . Years of education: Not on file  . Highest education level: Not on file  Occupational History  . Not on file  Social Needs  . Financial resource strain: Not on file  . Food insecurity:    Worry: Not on file    Inability: Not on file  . Transportation needs:    Medical: Not on file    Non-medical: Not on file  Tobacco Use  . Smoking status: Former Smoker    Last attempt to quit: 1997    Years since quitting: 23.1  . Smokeless tobacco: Never Used  Substance and Sexual Activity  . Alcohol use: Yes    Alcohol/week: 0.0 standard drinks    Comment: occasionally - may have 2-4 drinks/month  . Drug use: No  . Sexual activity: Yes  Lifestyle  . Physical activity:    Days per week: Not on file    Minutes per session: Not on file  . Stress: Not on file  Relationships  . Social connections:    Talks on phone: Not on file    Gets together: Not on file    Attends religious service: Not on file    Active member of club or organization: Not on file    Attends meetings of clubs or organizations: Not on file  Relationship status: Not on file  . Intimate partner violence:    Fear of current or ex partner: Not on file    Emotionally abused: Not on file    Physically abused: Not on file    Forced sexual activity: Not on file  Other Topics Concern  . Not on file  Social History Narrative  . Not on file    Review of Systems: See HPI, otherwise negative ROS  Physical Exam: BP (!) 121/56   Pulse 89   Ht 5\' 5"  (1.651 m)   Wt 83 kg   LMP 08/21/2018 Comment: preg test neg  SpO2 96%   BMI 30.45 kg/m  General:   Alert,  pleasant and cooperative in NAD Head:  Normocephalic and atraumatic. Neck:  Supple; no masses or thyromegaly. Lungs:  Clear throughout to auscultation.    Heart:  Regular rate and rhythm. Abdomen:  Soft, nontender and nondistended. Normal bowel sounds, without guarding, and without rebound.   Neurologic:  Alert and  oriented x4;   grossly normal neurologically.  Impression/Plan: Jacqueline Pham is now here to undergo a screening colonoscopy.  Risks, benefits, and alternatives regarding colonoscopy have been reviewed with the patient.  Questions have been answered.  All parties agreeable.

## 2018-08-30 NOTE — Anesthesia Postprocedure Evaluation (Signed)
Anesthesia Post Note  Patient: Jacqueline Pham  Procedure(s) Performed: COLONOSCOPY WITH PROPOFOL (N/A ) POLYPECTOMY (Rectum)  Patient location during evaluation: PACU Anesthesia Type: General Level of consciousness: awake Pain management: pain level controlled Vital Signs Assessment: post-procedure vital signs reviewed and stable Respiratory status: respiratory function stable Cardiovascular status: stable Postop Assessment: no signs of nausea or vomiting Anesthetic complications: no    Veda Canning

## 2018-09-02 ENCOUNTER — Encounter: Payer: Self-pay | Admitting: Gastroenterology

## 2019-07-08 HISTORY — PX: CATARACT EXTRACTION W/ INTRAOCULAR LENS IMPLANT: SHX1309

## 2019-08-01 ENCOUNTER — Other Ambulatory Visit: Payer: Self-pay

## 2019-08-01 ENCOUNTER — Telehealth: Payer: Self-pay

## 2019-08-01 DIAGNOSIS — Z86010 Personal history of colonic polyps: Secondary | ICD-10-CM

## 2019-08-01 NOTE — Telephone Encounter (Signed)
Patient would like to have her repeat colonoscopy on Feb 15th with Dr. Allen Norris at [facility].  Instructions will be sent to her and referral created.  Thanks [employer]

## 2019-09-04 ENCOUNTER — Other Ambulatory Visit: Payer: Self-pay

## 2019-09-04 ENCOUNTER — Encounter: Payer: Self-pay | Admitting: Gastroenterology

## 2019-09-04 ENCOUNTER — Other Ambulatory Visit
Admission: RE | Admit: 2019-09-04 | Discharge: 2019-09-04 | Disposition: A | Discharge status: Home / Self Care | Payer: 59 | Source: Ambulatory Visit | Attending: Gastroenterology | Admitting: Gastroenterology

## 2019-09-04 NOTE — Progress Notes (Signed)
Positive covid test in care everywhere on 06/13/2019. No need to retest at this time.

## 2019-09-05 NOTE — Discharge Instructions (Signed)
General Anesthesia, Adult, Care After This sheet gives you information about how to care for yourself after your procedure. Your health care provider may also give you more specific instructions. If you have problems or questions, contact your health care provider. What can I expect after the procedure? After the procedure, the following side effects are common:  Pain or discomfort at the IV site.  Nausea.  Vomiting.  Sore throat.  Trouble concentrating.  Feeling cold or chills.  Weak or tired.  Sleepiness and fatigue.  Soreness and body aches. These side effects can affect parts of the body that were not involved in surgery. Follow these instructions at home:  For at least 24 hours after the procedure:  Have a responsible adult stay with you. It is important to have someone help care for you until you are awake and alert.  Rest as needed.  Do not: ? Participate in activities in which you could fall or become injured. ? Drive. ? Use heavy machinery. ? Drink alcohol. ? Take sleeping pills or medicines that cause drowsiness. ? Make important decisions or sign legal documents. ? Take care of children on your own. Eating and drinking  Follow any instructions from your health care provider about eating or drinking restrictions.  When you feel hungry, start by eating small amounts of foods that are soft and easy to digest (bland), such as toast. Gradually return to your regular diet.  Drink enough fluid to keep your urine pale yellow.  If you vomit, rehydrate by drinking water, juice, or clear broth. General instructions  If you have sleep apnea, surgery and certain medicines can increase your risk for breathing problems. Follow instructions from your health care provider about wearing your sleep device: ? Anytime you are sleeping, including during daytime naps. ? While taking prescription pain medicines, sleeping medicines, or medicines that make you drowsy.  Return to  your normal activities as told by your health care provider. Ask your health care provider what activities are safe for you.  Take over-the-counter and prescription medicines only as told by your health care provider.  If you smoke, do not smoke without supervision.  Keep all follow-up visits as told by your health care provider. This is important. Contact a health care provider if:  You have nausea or vomiting that does not get better with medicine.  You cannot eat or drink without vomiting.  You have pain that does not get better with medicine.  You are unable to pass urine.  You develop a skin rash.  You have a fever.  You have redness around your IV site that gets worse. Get help right away if:  You have difficulty breathing.  You have chest pain.  You have blood in your urine or stool, or you vomit blood. Summary  After the procedure, it is common to have a sore throat or nausea. It is also common to feel tired.  Have a responsible adult stay with you for the first 24 hours after general anesthesia. It is important to have someone help care for you until you are awake and alert.  When you feel hungry, start by eating small amounts of foods that are soft and easy to digest (bland), such as toast. Gradually return to your regular diet.  Drink enough fluid to keep your urine pale yellow.  Return to your normal activities as told by your health care provider. Ask your health care provider what activities are safe for you. This information is not   intended to replace advice given to you by your health care provider. Make sure you discuss any questions you have with your health care provider. Document Revised: 07/13/2017 Document Reviewed: 02/23/2017 Elsevier Patient Education  2020 [employer]  

## 2019-09-08 ENCOUNTER — Ambulatory Visit: Payer: No Typology Code available for payment source | Admitting: Anesthesiology

## 2019-09-08 ENCOUNTER — Other Ambulatory Visit: Payer: Self-pay

## 2019-09-08 ENCOUNTER — Ambulatory Visit
Admission: RE | Admit: 2019-09-08 | Discharge: 2019-09-08 | Disposition: A | Discharge status: Home / Self Care | Payer: No Typology Code available for payment source | Source: Ambulatory Visit | Attending: Gastroenterology | Admitting: Gastroenterology

## 2019-09-08 ENCOUNTER — Encounter
Admission: RE | Disposition: A | Discharge status: Home / Self Care | Payer: Self-pay | Source: Ambulatory Visit | Attending: Gastroenterology

## 2019-09-08 ENCOUNTER — Encounter: Payer: Self-pay | Admitting: Gastroenterology

## 2019-09-08 DIAGNOSIS — E669 Obesity, unspecified: Secondary | ICD-10-CM | POA: Diagnosis not present

## 2019-09-08 DIAGNOSIS — Z09 Encounter for follow-up examination after completed treatment for conditions other than malignant neoplasm: Secondary | ICD-10-CM | POA: Diagnosis present

## 2019-09-08 DIAGNOSIS — Z6830 Body mass index (BMI) 30.0-30.9, adult: Secondary | ICD-10-CM | POA: Insufficient documentation

## 2019-09-08 DIAGNOSIS — K640 First degree hemorrhoids: Secondary | ICD-10-CM | POA: Diagnosis not present

## 2019-09-08 DIAGNOSIS — Z860100 Personal history of colon polyps, unspecified: Secondary | ICD-10-CM

## 2019-09-08 DIAGNOSIS — J45909 Unspecified asthma, uncomplicated: Secondary | ICD-10-CM | POA: Diagnosis not present

## 2019-09-08 DIAGNOSIS — Z87891 Personal history of nicotine dependence: Secondary | ICD-10-CM | POA: Insufficient documentation

## 2019-09-08 DIAGNOSIS — K635 Polyp of colon: Secondary | ICD-10-CM | POA: Insufficient documentation

## 2019-09-08 DIAGNOSIS — D120 Benign neoplasm of cecum: Secondary | ICD-10-CM

## 2019-09-08 HISTORY — PX: COLONOSCOPY WITH PROPOFOL: SHX5780

## 2019-09-08 HISTORY — PX: POLYPECTOMY: SHX5525

## 2019-09-08 LAB — POCT PREGNANCY, URINE: Preg Test, Ur: NEGATIVE

## 2019-09-08 SURGERY — COLONOSCOPY WITH PROPOFOL
Anesthesia: General | Site: Rectum

## 2019-09-08 MED ORDER — LACTATED RINGERS IV SOLN: INTRAVENOUS | Status: DC

## 2019-09-08 MED ORDER — PROPOFOL 10 MG/ML IV BOLUS
INTRAVENOUS | Status: DC | PRN
  Administered 2019-09-08: 150 mg via INTRAVENOUS
  Administered 2019-09-08 (×4): 40 mg via INTRAVENOUS

## 2019-09-08 MED ORDER — LIDOCAINE HCL (CARDIAC) PF 100 MG/5ML IV SOSY
PREFILLED_SYRINGE | INTRAVENOUS | Status: DC | PRN
  Administered 2019-09-08: 30 mg via INTRAVENOUS

## 2019-09-08 MED ORDER — STERILE WATER FOR IRRIGATION IR SOLN
Status: DC | PRN
  Administered 2019-09-08: 50 mL

## 2019-09-08 SURGICAL SUPPLY — 5 items
CANISTER SUCT 1200ML W/VALVE (MISCELLANEOUS) ×2 IMPLANT
GOWN CVR UNV OPN BCK APRN NK (MISCELLANEOUS) ×2 IMPLANT
GOWN ISOL THUMB LOOP REG UNIV (MISCELLANEOUS) ×2
KIT ENDO PROCEDURE OLY (KITS) ×2 IMPLANT
WATER STERILE IRR 250ML POUR (IV SOLUTION) ×2 IMPLANT

## 2019-09-08 NOTE — H&P (Signed)
[name], MD [address] [place], [place] [address] [phone] Fax : [phone]  Primary Care Physician:  Juline Patch, MD Primary Gastroenterologist:  Dr. Allen Norris  Pre-Procedure History & Physical: HPI:  Jacqueline Pham is a 54 y.o. female is here for an colonoscopy.   Past Medical History:  Diagnosis Date  . Asthma   . Wears contact lenses    left eye only    Past Surgical History:  Procedure Laterality Date  . ABLATION  2015   uterine  . CATARACT EXTRACTION W/ INTRAOCULAR LENS IMPLANT Right 07/08/2019   [facility]  . COLONOSCOPY WITH PROPOFOL N/A 08/30/2018   Procedure: COLONOSCOPY WITH PROPOFOL;  Surgeon: [name], MD;  Location: [facility];  Service: Endoscopy;  Laterality: N/A;  . DILATION AND CURETTAGE OF UTERUS  2003  . POLYPECTOMY  08/30/2018   Procedure: POLYPECTOMY;  Surgeon: [name], MD;  Location: [facility];  Service: Endoscopy;;    Prior to Admission medications   Medication Sig Start Date End Date Taking? Authorizing Provider  albuterol (PROVENTIL HFA;VENTOLIN HFA) 108 (90 Base) MCG/ACT inhaler Inhale 1-2 puffs into the lungs every 6 (six) hours as needed for wheezing or shortness of breath. 07/05/18  Yes Cook, Jayce G, DO  Cyanocobalamin (VITAMIN B-12 PO) Take by mouth daily.   Yes [provider]  Melatonin 5 MG TABS Take 5 mg by mouth at bedtime as needed.   Yes [provider]  Multiple Vitamin (MULTIVITAMIN) tablet Take 1 tablet by mouth daily.   Yes [provider]  Omega-3 Fatty Acids (FISH OIL PO) Take by mouth daily.   Yes [provider]  meloxicam (MOBIC) 15 MG tablet Take 1 tablet (15 mg total) by mouth daily. Patient not taking: Reported on 09/04/2019 07/31/18   Juline Patch, MD    Allergies as of 08/01/2019  . (No Known Allergies)    Family History  Problem Relation Age of Onset  . Liver cancer Mother   . Cancer Mother   . Heart attack Father   .  Cancer Father   . Heart disease Father   . Stroke Maternal Grandmother   . Diabetes Maternal Grandfather   . Heart disease Paternal Grandmother     Social History   Socioeconomic History  . Marital status: Married    Spouse name: Not on file  . Number of children: Not on file  . Years of education: Not on file  . Highest education level: Not on file  Occupational History  . Not on file  Tobacco Use  . Smoking status: Former Smoker    Quit date: 1997    Years since quitting: 24.1  . Smokeless tobacco: Never Used  Substance and Sexual Activity  . Alcohol use: Yes    Alcohol/week: 0.0 standard drinks    Comment: occasionally - may have 2-4 drinks/month  . Drug use: No  . Sexual activity: Yes  Other Topics Concern  . Not on file  Social History Narrative  . Not on file   Social Determinants of Health   Financial Resource Strain:   . Difficulty of Paying Living Expenses: Not on file  Food Insecurity:   . Worried About Charity fundraiser in the Last Year: Not on file  . Ran Out of Food in the Last Year: Not on file  Transportation Needs:   . Lack of Transportation (Medical): Not on file  . Lack of Transportation (Non-Medical): Not on file  Physical Activity:   . Days of Exercise per Week: Not on file  . Minutes of Exercise per Session: Not on file  Stress:   . Feeling of Stress : Not on file  Social Connections:   . Frequency of Communication with Friends and Family: Not on file  . Frequency of Social Gatherings with Friends and Family: Not on file  . Attends Religious Services: Not on file  . Active Member of Clubs or Organizations: Not on file  . Attends Archivist Meetings: Not on file  . Marital Status: Not on file  Intimate Partner Violence:   . Fear of Current or Ex-Partner: Not on file  . Emotionally Abused: Not on file  . Physically Abused: Not on file  . Sexually Abused: Not on file    Review of Systems: See HPI, otherwise negative  ROS  Physical Exam: BP (!) 133/52   Pulse 76   Temp 98.4 F (36.9 C) (Temporal)   Resp 16   Ht 5\' 5"  (1.651 m)   Wt 83.6 kg   LMP 08/18/2019 (Approximate) Comment: preg test neg  SpO2 96%   BMI 30.69 kg/m  General:   Alert,  pleasant and cooperative in NAD Head:  Normocephalic and atraumatic. Neck:  Supple; no masses or thyromegaly. Lungs:  Clear throughout to auscultation.    Heart:  Regular rate and rhythm. Abdomen:  Soft, nontender and nondistended. Normal bowel sounds, without guarding, and without rebound.   Neurologic:  Alert and  oriented x4;  grossly normal neurologically.  Impression/Plan: Kess Fetty is here for an colonoscopy to be performed for history of adenomatous polyps 08/2018  Risks, benefits, limitations, and alternatives regarding  colonoscopy have been reviewed with the patient.  Questions have been answered.  All parties agreeable.   [name], MD  09/08/2019, 11:35 AM

## 2019-09-08 NOTE — Anesthesia Postprocedure Evaluation (Signed)
Anesthesia Post Note  Patient: Jacqueline Pham  Procedure(s) Performed: COLONOSCOPY WITH PROPOFOL (N/A Rectum) POLYPECTOMY (N/A Rectum)     Patient location during evaluation: PACU Anesthesia Type: General Level of consciousness: awake and alert Pain management: pain level controlled Vital Signs Assessment: post-procedure vital signs reviewed and stable Respiratory status: spontaneous breathing, nonlabored ventilation, respiratory function stable and patient connected to nasal cannula oxygen Cardiovascular status: blood pressure returned to baseline and stable Postop Assessment: no apparent nausea or vomiting Anesthetic complications: no    Wanda Plump Juno Alers

## 2019-09-08 NOTE — Transfer of Care (Signed)
Immediate Anesthesia Transfer of Care Note  Patient: Jacqueline Pham  Procedure(s) Performed: COLONOSCOPY WITH PROPOFOL (N/A Rectum) POLYPECTOMY (N/A Rectum)  Patient Location: PACU  Anesthesia Type: General  Level of Consciousness: awake, alert  and patient cooperative  Airway and Oxygen Therapy: Patient Spontanous Breathing and Patient connected to supplemental oxygen  Post-op Assessment: Post-op Vital signs reviewed, Patient's Cardiovascular Status Stable, Respiratory Function Stable, Patent Airway and No signs of Nausea or vomiting  Post-op Vital Signs: Reviewed and stable  Complications: No apparent anesthesia complications

## 2019-09-08 NOTE — Anesthesia Procedure Notes (Signed)
Date/Time: 09/08/2019 12:10 PM Performed by: Cameron Ali, CRNA Pre-anesthesia Checklist: Patient identified, Emergency Drugs available, Suction available, Timeout performed and Patient being monitored Patient Re-evaluated:Patient Re-evaluated prior to induction Oxygen Delivery Method: Nasal cannula Placement Confirmation: positive ETCO2

## 2019-09-08 NOTE — Op Note (Signed)
[facility] Gastroenterology Patient Name: Jacqueline Pham Procedure Date: 09/08/2019 12:05 PM MRN: [id number] Account #: [id number] Date of Birth: 28-Oct-1965 Admit Type: Outpatient Age: 54 Room: [facility] OR ROOM 01 Gender: Female Note Status: Finalized Procedure:             Colonoscopy Indications:           High risk colon cancer surveillance: Personal history                         of colonic polyps Providers:             [name] MD, MD Referring MD:          Juline Patch, MD (Referring MD) Medicines:             Propofol per Anesthesia Complications:         No immediate complications. Procedure:             Pre-Anesthesia Assessment:                        - Prior to the procedure, a History and Physical was                         performed, and patient medications and allergies were                         reviewed. The patient's tolerance of previous                         anesthesia was also reviewed. The risks and benefits                         of the procedure and the sedation options and risks                         were discussed with the patient. All questions were                         answered, and informed consent was obtained. Prior                         Anticoagulants: The patient has taken no previous                         anticoagulant or antiplatelet agents. ASA Grade                         Assessment: II - A patient with mild systemic disease.                         After reviewing the risks and benefits, the patient                         was deemed in satisfactory condition to undergo the                         procedure.  After obtaining informed consent, the colonoscope was                         passed under direct vision. Throughout the procedure,                         the patient's blood pressure, pulse, and oxygen                         saturations were monitored continuously. The was                   introduced through the anus and advanced to the the                         cecum, identified by appendiceal orifice and ileocecal                         valve. The colonoscopy was performed without                         difficulty. The patient tolerated the procedure well.                         The quality of the bowel preparation was excellent. Findings:      The perianal and digital rectal examinations were normal.      A 3 mm polyp was found in the cecum. The polyp was sessile. The polyp       was removed with a cold biopsy forceps. Resection and retrieval were       complete.      Non-bleeding internal hemorrhoids were found during retroflexion. The       hemorrhoids were Grade I (internal hemorrhoids that do not prolapse). Impression:            - One 3 mm polyp in the cecum, removed with a cold                         biopsy forceps. Resected and retrieved.                        - Non-bleeding internal hemorrhoids. Recommendation:        - Discharge patient to home.                        - Resume previous diet.                        - Continue present medications.                        - Await pathology results.                        - Repeat colonoscopy in 5 years for surveillance. Procedure Code(s):     --- Professional ---                        [phone], Colonoscopy, flexible; with biopsy, single or  multiple Diagnosis Code(s):     --- Professional ---                        Z86.010, Personal history of colonic polyps                        K63.5, Polyp of colon CPT copyright 2019 American Medical Association. All rights reserved. The codes documented in this report are preliminary and upon coder review may  be revised to meet current compliance requirements. [name] MD, MD 09/08/2019 12:25:49 PM This report has been signed electronically. Number of Addenda: 0 Note Initiated On: 09/08/2019 12:05 PM Scope Withdrawal Time: 0  hours 6 minutes 52 seconds  Total Procedure Duration: 0 hours 8 minutes 57 seconds  Estimated Blood Loss:  Estimated blood loss: none.      [facility]

## 2019-09-08 NOTE — Anesthesia Preprocedure Evaluation (Signed)
Anesthesia Evaluation  Patient identified by MRN, date of birth, ID band Patient awake    Reviewed: Allergy & Precautions, NPO status , Patient's Chart, lab work & pertinent test results  Airway Mallampati: II  TM Distance: >3 FB     Dental   Pulmonary asthma , former smoker,    breath sounds clear to auscultation       Cardiovascular negative cardio ROS   Rhythm:Regular Rate:Normal     Neuro/Psych    GI/Hepatic negative GI ROS,   Endo/Other  Obesity - BMI 31  Renal/GU      Musculoskeletal   Abdominal   Peds  Hematology   Anesthesia Other Findings   Reproductive/Obstetrics                             Anesthesia Physical  Anesthesia Plan  ASA: II  Anesthesia Plan: General   Post-op Pain Management:    Induction: Intravenous  PONV Risk Score and Plan: Propofol infusion  Airway Management Planned: Nasal Cannula and Natural Airway  Additional Equipment:   Intra-op Plan:   Post-operative Plan:   Informed Consent: I have reviewed the patients History and Physical, chart, labs and discussed the procedure including the risks, benefits and alternatives for the proposed anesthesia with the patient or authorized representative who has indicated his/her understanding and acceptance.       Plan Discussed with: CRNA, Anesthesiologist and Surgeon  Anesthesia Plan Comments:         Anesthesia Quick Evaluation

## 2019-09-09 ENCOUNTER — Encounter: Payer: Self-pay | Admitting: *Deleted

## 2019-09-10 LAB — SURGICAL PATHOLOGY

## 2019-09-14 ENCOUNTER — Encounter: Payer: Self-pay | Admitting: Gastroenterology

## 2020-01-05 IMAGING — CR DG CHEST 2V
2 series · 2 of 2 positions shown · non-contrast
Comparison: 07/21/2014

CLINICAL DATA: Persistent cough for several weeks

EXAM:
CHEST - 2 VIEW

[chest pa]
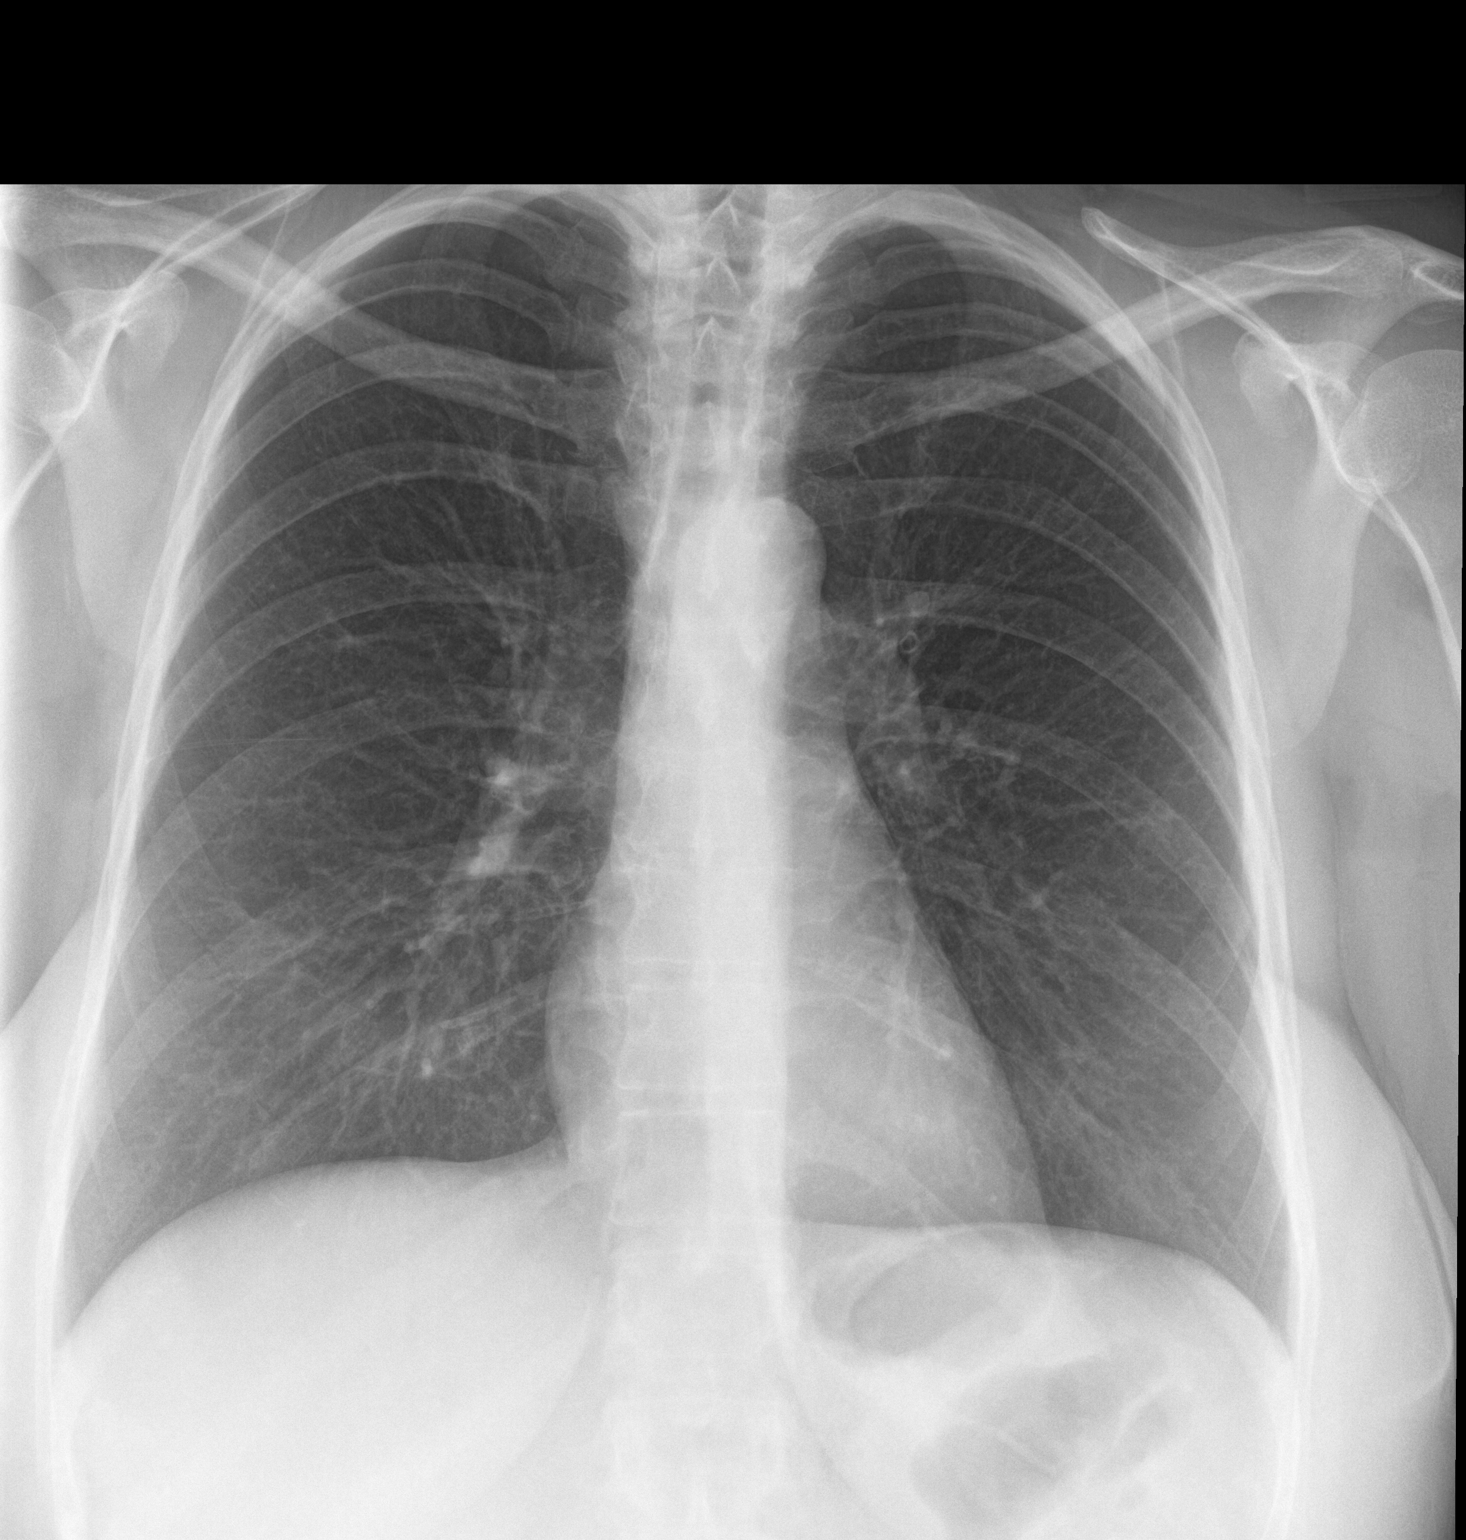

[chest lat]
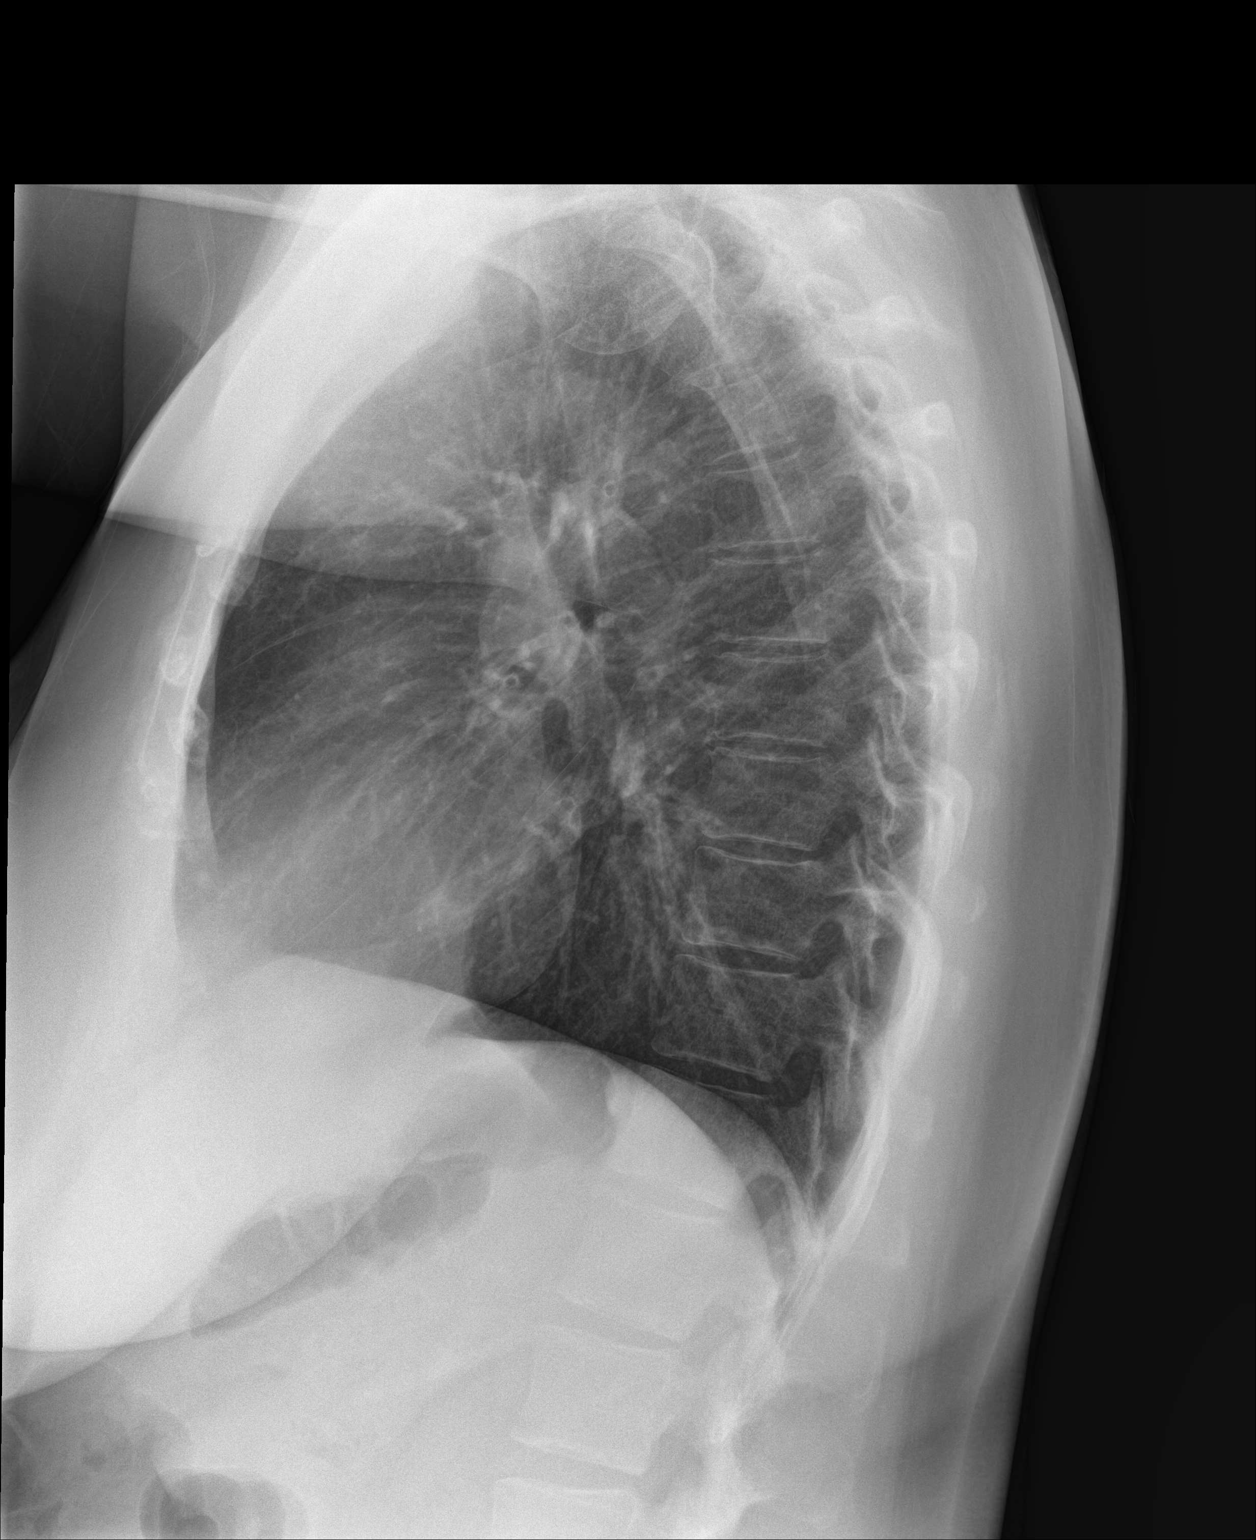

[2 of 2 positions shown; findings below may reference images not displayed]

FINDINGS: The heart size and mediastinal contours are within normal limits.
Both lungs are clear. The visualized skeletal structures are
unremarkable.
IMPRESSION: No active cardiopulmonary disease.

## 2020-02-10 ENCOUNTER — Other Ambulatory Visit: Payer: Self-pay

## 2020-02-10 ENCOUNTER — Ambulatory Visit (INDEPENDENT_AMBULATORY_CARE_PROVIDER_SITE_OTHER): Payer: No Typology Code available for payment source | Admitting: Family Medicine

## 2020-02-10 ENCOUNTER — Encounter: Payer: Self-pay | Admitting: Family Medicine

## 2020-02-10 VITALS — BP 120/84 | HR 64 | Ht 65.0 in | Wt 182.0 lb

## 2020-02-10 DIAGNOSIS — E782 Mixed hyperlipidemia: Secondary | ICD-10-CM

## 2020-02-10 DIAGNOSIS — J4520 Mild intermittent asthma, uncomplicated: Secondary | ICD-10-CM | POA: Diagnosis not present

## 2020-02-10 MED ORDER — ALBUTEROL SULFATE HFA 108 (90 BASE) MCG/ACT IN AERS: 1.0000 | INHALATION_SPRAY | Freq: Four times a day (QID) | RESPIRATORY_TRACT | 11 refills | Status: DC | PRN

## 2020-02-10 NOTE — Progress Notes (Signed)
Date:  02/10/2020   Name:  Jacqueline Pham   DOB:  1965-10-03   MRN:  [id number]   Chief Complaint: Hyperlipidemia and Asthma (refill inhaler)  Hyperlipidemia This is a chronic problem. The current episode started more than 1 year ago. The problem is controlled. Recent lipid tests were reviewed and are normal. She has no history of chronic renal disease, diabetes, hypothyroidism, liver disease, obesity or nephrotic syndrome. Factors aggravating her hyperlipidemia include thiazides. Pertinent negatives include no chest pain, focal sensory loss, focal weakness, leg pain, myalgias or shortness of breath. Current antihyperlipidemic treatment includes statins. The current treatment provides moderate improvement of lipids. There are no compliance problems.  Risk factors for coronary artery disease include dyslipidemia.  Asthma She complains of wheezing. There is no chest tightness, cough, difficulty breathing, frequent throat clearing, hemoptysis, hoarse voice, shortness of breath or sputum production. Primary symptoms comments: mild. This is a chronic problem. The current episode started more than 1 year ago. The problem occurs intermittently. The problem has been gradually improving. Pertinent negatives include no appetite change, chest pain, dyspnea on exertion, ear congestion, ear pain, fever, headaches, heartburn, malaise/fatigue, myalgias, nasal congestion, orthopnea, PND, postnasal drip, rhinorrhea, sneezing, sore throat, sweats, trouble swallowing or weight loss. Her symptoms are aggravated by change in weather and pollen. Her symptoms are alleviated by beta-agonist. Her past medical history is significant for asthma.    Lab Results  Component Value Date   CREATININE 0.59 07/31/2018   BUN 13 07/31/2018   NA 137 07/31/2018   K 4.3 07/31/2018   CL 100 07/31/2018   CO2 23 07/31/2018   Lab Results  Component Value Date   CHOL 228 (H) 07/31/2018   HDL 59 07/31/2018   LDLCALC 148 (H)  07/31/2018   TRIG 107 07/31/2018   CHOLHDL 3.9 07/31/2018   No results found for: TSH No results found for: HGBA1C No results found for: WBC, HGB, HCT, MCV, PLT No results found for: ALT, AST, GGT, ALKPHOS, BILITOT   Review of Systems  Constitutional: Negative.  Negative for appetite change, chills, fatigue, fever, malaise/fatigue, unexpected weight change and weight loss.  HENT: Negative for congestion, ear discharge, ear pain, hoarse voice, postnasal drip, rhinorrhea, sinus pressure, sneezing, sore throat and trouble swallowing.   Eyes: Negative for photophobia, pain, discharge, redness and itching.  Respiratory: Positive for wheezing. Negative for cough, hemoptysis, sputum production, shortness of breath and stridor.   Cardiovascular: Negative for chest pain, dyspnea on exertion and PND.  Gastrointestinal: Negative for abdominal pain, blood in stool, constipation, diarrhea, heartburn, nausea and vomiting.  Endocrine: Negative for cold intolerance, heat intolerance, polydipsia, polyphagia and polyuria.  Genitourinary: Negative for dysuria, flank pain, frequency, hematuria, menstrual problem, pelvic pain, urgency, vaginal bleeding and vaginal discharge.  Musculoskeletal: Negative for arthralgias, back pain and myalgias.  Skin: Negative for rash.  Allergic/Immunologic: Negative for environmental allergies and food allergies.  Neurological: Negative for dizziness, focal weakness, weakness, light-headedness, numbness and headaches.  Hematological: Negative for adenopathy. Does not bruise/bleed easily.  Psychiatric/Behavioral: Negative for dysphoric mood. The patient is not nervous/anxious.     Patient Active Problem List   Diagnosis Date Noted  . Personal history of colonic polyps   . Encounter for screening colonoscopy   . Benign neoplasm of cecum   . History of migraine 07/23/2014  . Menorrhagia 06/02/2014    No Known Allergies  Past Surgical History:  Procedure Laterality Date   . ABLATION  2015   uterine  .  CATARACT EXTRACTION W/ INTRAOCULAR LENS IMPLANT Right 07/08/2019   [facility]  . COLONOSCOPY WITH PROPOFOL N/A 08/30/2018   Procedure: COLONOSCOPY WITH PROPOFOL;  Surgeon: [name], MD;  Location: [place];  Service: Endoscopy;  Laterality: N/A;  . COLONOSCOPY WITH PROPOFOL N/A 09/08/2019   Procedure: COLONOSCOPY WITH PROPOFOL;  Surgeon: [name], MD;  Location: [place];  Service: Endoscopy;  Laterality: N/A;  . DILATION AND CURETTAGE OF UTERUS  2003  . POLYPECTOMY  08/30/2018   Procedure: POLYPECTOMY;  Surgeon: [name], MD;  Location: [place];  Service: Endoscopy;;  . POLYPECTOMY N/A 09/08/2019   Procedure: POLYPECTOMY;  Surgeon: [name], MD;  Location: [place];  Service: Endoscopy;  Laterality: N/A;    Social History   Tobacco Use  . Smoking status: Former Smoker    Quit date: 1997    Years since quitting: 24.5  . Smokeless tobacco: Never Used  Vaping Use  . Vaping Use: Never used  Substance Use Topics  . Alcohol use: Yes    Alcohol/week: 0.0 standard drinks    Comment: occasionally - may have 2-4 drinks/month  . Drug use: No     Medication list has been reviewed and updated.  Current Meds  Medication Sig  . albuterol (PROVENTIL HFA;VENTOLIN HFA) 108 (90 Base) MCG/ACT inhaler Inhale 1-2 puffs into the lungs every 6 (six) hours as needed for wheezing or shortness of breath.  . Multiple Vitamin (MULTIVITAMIN) tablet Take 1 tablet by mouth daily.  . Omega-3 Fatty Acids (FISH OIL PO) Take by mouth daily.    PHQ 2/9 Scores 02/10/2020 02/28/2017  PHQ - 2 Score 0 0  PHQ- 9 Score 0 1    GAD 7 : Generalized Anxiety Score 02/10/2020  Nervous, Anxious, on Edge 0  Control/stop worrying 0  Worry too much - different things 0  Trouble relaxing 0  Restless 0  Easily annoyed or irritable 0  Afraid - awful might happen 0  Total GAD 7 Score 0    BP Readings from Last 3 Encounters:  02/10/20  120/84  09/08/19 (!) 103/54  08/30/18 (!) 116/55    Physical Exam Vitals and nursing note reviewed.  Constitutional:      Appearance: She is well-developed.  HENT:     Head: Normocephalic.     Right Ear: Tympanic membrane, ear canal and external ear normal. There is no impacted cerumen.     Left Ear: Tympanic membrane, ear canal and external ear normal. There is no impacted cerumen.     Nose: No congestion or rhinorrhea.  Eyes:     General: Lids are everted, no foreign bodies appreciated. No scleral icterus.       Left eye: No foreign body or hordeolum.     Conjunctiva/sclera: Conjunctivae normal.     Right eye: Right conjunctiva is not injected.     Left eye: Left conjunctiva is not injected.     Pupils: Pupils are equal, round, and reactive to light.  Neck:     Thyroid: No thyromegaly.     Vascular: No JVD.     Trachea: No tracheal deviation.  Cardiovascular:     Rate and Rhythm: Normal rate and regular rhythm.     Heart sounds: Normal heart sounds. No murmur heard.  No friction rub. No gallop.   Pulmonary:     Effort: Pulmonary effort is normal. No respiratory distress.     Breath sounds: Normal breath sounds. No wheezing, rhonchi or rales.  Abdominal:  General: Bowel sounds are normal.     Palpations: Abdomen is soft. There is no mass.     Tenderness: There is no abdominal tenderness. There is no guarding or rebound.  Musculoskeletal:        General: No tenderness. Normal range of motion.     Cervical back: Normal range of motion and neck supple.  Lymphadenopathy:     Cervical: No cervical adenopathy.  Skin:    General: Skin is warm.     Capillary Refill: Capillary refill takes less than 2 seconds.     Findings: No bruising, erythema or rash.  Neurological:     General: No focal deficit present.     Mental Status: She is alert and oriented to person, place, and time.     Cranial Nerves: No cranial nerve deficit.     Deep Tendon Reflexes: Reflexes normal.    Psychiatric:        Mood and Affect: Mood is not anxious or depressed.     Wt Readings from Last 3 Encounters:  02/10/20 182 lb (82.6 kg)  09/08/19 184 lb 6.4 oz (83.6 kg)  08/30/18 183 lb (83 kg)    BP 120/84   Pulse 64   Ht 5\' 5"  (1.651 m)   Wt 182 lb (82.6 kg)   LMP 01/25/2020 (Approximate)   BMI 30.29 kg/m   Assessment and Plan: 1. Moderate mixed hyperlipidemia not requiring statin therapy Chronic.  Controlled.  Stable.  Patient currently is with dietary approach and would like to maintain such.  We will check her lipid panel today fasting to assess current maintenance and further surveillance. - Lipid Panel With LDL/HDL Ratio  2. Mild intermittent reactive airway disease without complication Chronic.  Controlled.  Stable.  Continue albuterol inhaler 1 to 2 puffs every 6 hours as needed as needed for wheezing shortness of breath. - albuterol (VENTOLIN HFA) 108 (90 Base) MCG/ACT inhaler; Inhale 1-2 puffs into the lungs every 6 (six) hours as needed for wheezing or shortness of breath.  Dispense: 6.7 g; Refill: 11

## 2020-02-10 NOTE — Patient Instructions (Signed)

## 2020-02-11 LAB — LIPID PANEL WITH LDL/HDL RATIO
Cholesterol, Total: 282 mg/dL — ABNORMAL HIGH (ref 100–199)
HDL: 53 mg/dL (ref >39)
LDL Chol Calc (NIH): 200 mg/dL — ABNORMAL HIGH (ref 0–99)
LDL/HDL Ratio: 3.8 ratio — ABNORMAL HIGH (ref 0.0–3.2)
Triglycerides: 157 mg/dL — ABNORMAL HIGH (ref 0–149)
VLDL Cholesterol Cal: 29 mg/dL (ref 5–40)

## 2020-04-01 ENCOUNTER — Other Ambulatory Visit: Payer: Self-pay | Admitting: Obstetrics & Gynecology

## 2020-04-01 DIAGNOSIS — Z1231 Encounter for screening mammogram for malignant neoplasm of breast: Secondary | ICD-10-CM

## 2020-04-13 ENCOUNTER — Encounter: Payer: Self-pay | Admitting: Family Medicine

## 2020-04-20 ENCOUNTER — Ambulatory Visit
Admission: RE | Admit: 2020-04-20 | Discharge: 2020-04-20 | Disposition: A | Discharge status: Home / Self Care | Payer: No Typology Code available for payment source | Source: Ambulatory Visit | Attending: Obstetrics & Gynecology | Admitting: Obstetrics & Gynecology

## 2020-04-20 ENCOUNTER — Other Ambulatory Visit: Payer: Self-pay

## 2020-04-20 DIAGNOSIS — Z1231 Encounter for screening mammogram for malignant neoplasm of breast: Secondary | ICD-10-CM | POA: Insufficient documentation

## 2020-11-29 ENCOUNTER — Encounter: Payer: Self-pay | Admitting: Family Medicine

## 2021-02-16 ENCOUNTER — Encounter: Payer: No Typology Code available for payment source | Admitting: Family Medicine

## 2021-02-23 ENCOUNTER — Other Ambulatory Visit: Payer: Self-pay

## 2021-02-23 ENCOUNTER — Encounter: Payer: Self-pay | Admitting: Family Medicine

## 2021-02-23 ENCOUNTER — Ambulatory Visit: Payer: No Typology Code available for payment source | Admitting: Family Medicine

## 2021-02-23 VITALS — BP 130/88 | HR 64 | Ht 65.0 in | Wt 186.0 lb

## 2021-02-23 DIAGNOSIS — Z862 Personal history of diseases of the blood and blood-forming organs and certain disorders involving the immune mechanism: Secondary | ICD-10-CM | POA: Diagnosis not present

## 2021-02-23 DIAGNOSIS — J45909 Unspecified asthma, uncomplicated: Secondary | ICD-10-CM | POA: Insufficient documentation

## 2021-02-23 DIAGNOSIS — Z6830 Body mass index (BMI) 30.0-30.9, adult: Secondary | ICD-10-CM | POA: Diagnosis not present

## 2021-02-23 DIAGNOSIS — E782 Mixed hyperlipidemia: Secondary | ICD-10-CM

## 2021-02-23 DIAGNOSIS — J4520 Mild intermittent asthma, uncomplicated: Secondary | ICD-10-CM

## 2021-02-23 DIAGNOSIS — E785 Hyperlipidemia, unspecified: Secondary | ICD-10-CM | POA: Insufficient documentation

## 2021-02-23 NOTE — Patient Instructions (Signed)

## 2021-02-23 NOTE — Progress Notes (Signed)
Date:  02/23/2021   Name:  Jacqueline Pham   DOB:  July 27, 1965   MRN:  [id number]   Chief Complaint: Hyperlipidemia (Needs labs for insurance)  Hyperlipidemia This is a chronic problem. The current episode started more than 1 year ago. The problem is controlled. Recent lipid tests were reviewed and are normal. Exacerbating diseases include obesity. She has no history of chronic renal disease, diabetes, hypothyroidism, liver disease or nephrotic syndrome. Pertinent negatives include no chest pain, focal sensory loss, focal weakness, leg pain, myalgias or shortness of breath. Current antihyperlipidemic treatment includes diet change. The current treatment provides moderate improvement of lipids. There are no compliance problems.  Risk factors for coronary artery disease include dyslipidemia.   Lab Results  Component Value Date   CREATININE 0.59 07/31/2018   BUN 13 07/31/2018   NA 137 07/31/2018   K 4.3 07/31/2018   CL 100 07/31/2018   CO2 23 07/31/2018   Lab Results  Component Value Date   CHOL 282 (H) 02/10/2020   HDL 53 02/10/2020   LDLCALC 200 (H) 02/10/2020   TRIG 157 (H) 02/10/2020   CHOLHDL 3.9 07/31/2018   No results found for: TSH No results found for: HGBA1C No results found for: WBC, HGB, HCT, MCV, PLT No results found for: ALT, AST, GGT, ALKPHOS, BILITOT   Review of Systems  Constitutional:  Negative for chills and fever.  HENT:  Negative for drooling, ear discharge, ear pain and sore throat.   Respiratory:  Negative for cough, shortness of breath and wheezing.   Cardiovascular:  Negative for chest pain, palpitations and leg swelling.  Gastrointestinal:  Negative for abdominal pain, blood in stool, constipation, diarrhea and nausea.  Endocrine: Negative for polydipsia.  Genitourinary:  Negative for dysuria, frequency, hematuria and urgency.  Musculoskeletal:  Negative for back pain, myalgias and neck pain.  Skin:  Negative for rash.  Allergic/Immunologic:  Negative for environmental allergies.  Neurological:  Negative for dizziness, focal weakness and headaches.  Hematological:  Does not bruise/bleed easily.  Psychiatric/Behavioral:  Negative for suicidal ideas. The patient is not nervous/anxious.    Patient Active Problem List   Diagnosis Date Noted   Personal history of colonic polyps    Encounter for screening colonoscopy    Benign neoplasm of cecum    History of migraine 07/23/2014   Menorrhagia 06/02/2014    No Known Allergies  Past Surgical History:  Procedure Laterality Date   ABLATION  2015   uterine   CATARACT EXTRACTION W/ INTRAOCULAR LENS IMPLANT Right 07/08/2019   [facility]   COLONOSCOPY WITH PROPOFOL N/A 08/30/2018   Procedure: COLONOSCOPY WITH PROPOFOL;  Surgeon: [name], MD;  Location: [place];  Service: Endoscopy;  Laterality: N/A;   COLONOSCOPY WITH PROPOFOL N/A 09/08/2019   Procedure: COLONOSCOPY WITH PROPOFOL;  Surgeon: [name], MD;  Location: [place];  Service: Endoscopy;  Laterality: N/A;   DILATION AND CURETTAGE OF UTERUS  2003   POLYPECTOMY  08/30/2018   Procedure: POLYPECTOMY;  Surgeon: [name], MD;  Location: [place];  Service: Endoscopy;;   POLYPECTOMY N/A 09/08/2019   Procedure: POLYPECTOMY;  Surgeon: [name], MD;  Location: [place];  Service: Endoscopy;  Laterality: N/A;    Social History   Tobacco Use   Smoking status: Former    Types: Cigarettes    Quit date: 1997    Years since quitting: 25.6   Smokeless tobacco: Never  Vaping Use   Vaping Use: Never used  Substance Use Topics   Alcohol use: Yes    Alcohol/week: 0.0 standard drinks    Comment: occasionally - may have 2-4 drinks/month   Drug use: No     Medication list has been reviewed and updated.  Current Meds  Medication Sig   albuterol (VENTOLIN HFA) 108 (90 Base) MCG/ACT inhaler Inhale 1-2 puffs into the lungs every 6 (six) hours as needed for wheezing or shortness of  breath.   Melatonin 5 MG TABS Take 5 mg by mouth at bedtime as needed.   Multiple Vitamin (MULTIVITAMIN) tablet Take 1 tablet by mouth daily.   Omega-3 Fatty Acids (FISH OIL PO) Take by mouth daily.    PHQ 2/9 Scores 02/23/2021 02/10/2020 02/28/2017  PHQ - 2 Score 0 0 0  PHQ- 9 Score 0 0 1    GAD 7 : Generalized Anxiety Score 02/23/2021 02/10/2020  Nervous, Anxious, on Edge 0 0  Control/stop worrying 0 0  Worry too much - different things 0 0  Trouble relaxing 0 0  Restless 0 0  Easily annoyed or irritable 0 0  Afraid - awful might happen 0 0  Total GAD 7 Score 0 0    BP Readings from Last 3 Encounters:  02/23/21 130/88  02/10/20 120/84  09/08/19 (!) 103/54    Physical Exam Vitals and nursing note reviewed.  Constitutional:      Appearance: She is well-developed.  HENT:     Head: Normocephalic.     Right Ear: Tympanic membrane, ear canal and external ear normal.     Left Ear: Tympanic membrane, ear canal and external ear normal.     Nose: Nose normal.     Mouth/Throat:     Mouth: Mucous membranes are moist. Mucous membranes are pale.     Tongue: No lesions.     Pharynx: No oropharyngeal exudate or posterior oropharyngeal erythema.  Eyes:     General: Lids are everted, no foreign bodies appreciated. No scleral icterus.       Left eye: No foreign body or hordeolum.     Conjunctiva/sclera: Conjunctivae normal.     Right eye: Right conjunctiva is not injected.     Left eye: Left conjunctiva is not injected.     Pupils: Pupils are equal, round, and reactive to light.  Neck:     Thyroid: No thyroid mass or thyromegaly.     Vascular: No JVD.     Trachea: No tracheal deviation.  Cardiovascular:     Rate and Rhythm: Normal rate and regular rhythm.     Heart sounds: Normal heart sounds, S1 normal and S2 normal. No murmur heard. No systolic murmur is present.  No diastolic murmur is present.    No friction rub. No gallop. No S3 or S4 sounds.  Pulmonary:     Effort: Pulmonary  effort is normal. No respiratory distress.     Breath sounds: Normal breath sounds. No decreased breath sounds, wheezing, rhonchi or rales.  Abdominal:     General: Bowel sounds are normal.     Palpations: Abdomen is soft. There is no hepatomegaly, splenomegaly or mass.     Tenderness: There is no abdominal tenderness. There is no guarding or rebound.  Musculoskeletal:        General: No tenderness. Normal range of motion.     Cervical back: Normal range of motion and neck supple.     Right lower leg: No edema.     Left lower leg: No edema.  Lymphadenopathy:  Cervical: No cervical adenopathy.     Right cervical: No superficial, deep or posterior cervical adenopathy.    Left cervical: No superficial, deep or posterior cervical adenopathy.  Skin:    General: Skin is warm.     Findings: No rash.  Neurological:     Mental Status: She is alert and oriented to person, place, and time.     Cranial Nerves: No cranial nerve deficit.     Deep Tendon Reflexes: Reflexes normal.  Psychiatric:        Mood and Affect: Mood is not anxious or depressed.    Wt Readings from Last 3 Encounters:  02/23/21 186 lb (84.4 kg)  02/10/20 182 lb (82.6 kg)  09/08/19 184 lb 6.4 oz (83.6 kg)    BP 130/88   Pulse 64   Ht '5\' 5"'$  (1.651 m)   Wt 186 lb (84.4 kg)   BMI 30.95 kg/m   Assessment and Plan:  1. Moderate mixed hyperlipidemia not requiring statin therapy Chronic.  Controlled.  Stable.  Patient has noted elevated cholesterol in the past and we will recheck a lipid panel for current status of LDL and triglycerides. - Renal Function Panel - Lipid Panel With LDL/HDL Ratio  2. Mild intermittent reactive airway disease without complication Chronic.  Intermittent.  Mild in intensity.  Uncomplicated.  This is controlled with albuterol inhaler 1 to 2 puffs every 6 hours as needed.  3. BMI 30.0-30.9,adult Health risks of being over weight were discussed and patient was counseled on weight loss  options and exercise.  Diet sheet given - Lipid Panel With LDL/HDL Ratio  4. History of anemia Patient is coming off a heavy period on her last cycle.  Conjunctiva were noted to be pale lysis her mucous membranes and we will check a CBC for anemia given her history of anemia. - Renal Function Panel - CBC with Differential/Platelet

## 2021-02-24 ENCOUNTER — Other Ambulatory Visit: Payer: Self-pay

## 2021-02-24 DIAGNOSIS — E782 Mixed hyperlipidemia: Secondary | ICD-10-CM

## 2021-02-24 LAB — CBC WITH DIFFERENTIAL/PLATELET
Basophils Absolute: 0.1 10*3/uL (ref 0.0–0.2)
Basos: 1 %
EOS (ABSOLUTE): 1.4 10*3/uL — ABNORMAL HIGH (ref 0.0–0.4)
Eos: 19 %
Hematocrit: 39 % (ref 34.0–46.6)
Hemoglobin: 13.2 g/dL (ref 11.1–15.9)
Immature Grans (Abs): 0 10*3/uL (ref 0.0–0.1)
Immature Granulocytes: 0 %
Lymphocytes Absolute: 2 10*3/uL (ref 0.7–3.1)
Lymphs: 26 %
MCH: 29.9 pg (ref 26.6–33.0)
MCHC: 33.8 g/dL (ref 31.5–35.7)
MCV: 88 fL (ref 79–97)
Monocytes Absolute: 0.5 10*3/uL (ref 0.1–0.9)
Monocytes: 6 %
Neutrophils Absolute: 3.6 10*3/uL (ref 1.4–7.0)
Neutrophils: 48 %
Platelets: 316 10*3/uL (ref 150–450)
RBC: 4.41 x10E6/uL (ref 3.77–5.28)
RDW: 11.5 % — ABNORMAL LOW (ref 11.7–15.4)
WBC: 7.6 10*3/uL (ref 3.4–10.8)

## 2021-02-24 LAB — RENAL FUNCTION PANEL
Albumin: 4 g/dL (ref 3.8–4.9)
BUN/Creatinine Ratio: 16 (ref 9–23)
BUN: 10 mg/dL (ref 6–24)
CO2: 24 mmol/L (ref 20–29)
Calcium: 9.3 mg/dL (ref 8.7–10.2)
Chloride: 101 mmol/L (ref 96–106)
Creatinine, Ser: 0.61 mg/dL (ref 0.57–1.00)
Glucose: 88 mg/dL (ref 65–99)
Phosphorus: 3.3 mg/dL (ref 3.0–4.3)
Potassium: 4.6 mmol/L (ref 3.5–5.2)
Sodium: 140 mmol/L (ref 134–144)
eGFR: 107 mL/min/{1.73_m2} (ref >59)

## 2021-02-24 LAB — LIPID PANEL WITH LDL/HDL RATIO
Cholesterol, Total: 283 mg/dL — ABNORMAL HIGH (ref 100–199)
HDL: 62 mg/dL (ref >39)
LDL Chol Calc (NIH): 193 mg/dL — ABNORMAL HIGH (ref 0–99)
LDL/HDL Ratio: 3.1 ratio (ref 0.0–3.2)
Triglycerides: 154 mg/dL — ABNORMAL HIGH (ref 0–149)
VLDL Cholesterol Cal: 28 mg/dL (ref 5–40)

## 2021-02-24 MED ORDER — ATORVASTATIN CALCIUM 10 MG PO TABS: 10.0000 mg | ORAL_TABLET | Freq: Every day | ORAL | 1 refills | Status: DC

## 2021-03-18 ENCOUNTER — Other Ambulatory Visit: Payer: Self-pay | Admitting: Family Medicine

## 2021-03-18 DIAGNOSIS — E782 Mixed hyperlipidemia: Secondary | ICD-10-CM

## 2021-04-20 ENCOUNTER — Other Ambulatory Visit: Payer: Self-pay | Admitting: Family Medicine

## 2021-04-20 DIAGNOSIS — E782 Mixed hyperlipidemia: Secondary | ICD-10-CM

## 2021-06-21 ENCOUNTER — Encounter: Payer: Self-pay | Admitting: Family Medicine

## 2021-06-21 ENCOUNTER — Other Ambulatory Visit: Payer: Self-pay

## 2021-06-21 ENCOUNTER — Encounter: Payer: Self-pay | Admitting: Internal Medicine

## 2021-06-21 ENCOUNTER — Ambulatory Visit: Payer: No Typology Code available for payment source | Admitting: Internal Medicine

## 2021-06-21 VITALS — BP 122/74 | HR 78 | Temp 97.9°F | Ht 65.0 in | Wt 193.2 lb

## 2021-06-21 DIAGNOSIS — E782 Mixed hyperlipidemia: Secondary | ICD-10-CM | POA: Diagnosis not present

## 2021-06-21 DIAGNOSIS — J0100 Acute maxillary sinusitis, unspecified: Secondary | ICD-10-CM | POA: Diagnosis not present

## 2021-06-21 MED ORDER — ATORVASTATIN CALCIUM 10 MG PO TABS: 10.0000 mg | ORAL_TABLET | Freq: Every day | ORAL | 2 refills | Status: DC

## 2021-06-21 MED ORDER — AMOXICILLIN-POT CLAVULANATE 875-125 MG PO TABS: 1.0000 | ORAL_TABLET | Freq: Two times a day (BID) | ORAL | 0 refills | Status: AC

## 2021-06-21 NOTE — Patient Instructions (Signed)
Flonase nasal spray, Coricidin or Sudafed  Take Nyquil at bedtime so you can rest

## 2021-06-21 NOTE — Progress Notes (Signed)
Date:  06/21/2021   Name:  Jacqueline Pham   DOB:  Sep 16, 1965   MRN:  [id number]   Chief Complaint: Cough  Cough This is a new problem. The current episode started 1 to 4 weeks ago. The problem has been gradually worsening. The problem occurs hourly. The cough is Non-productive. Associated symptoms include chills, ear pain (Left), a fever, headaches, nasal congestion, postnasal drip and rhinorrhea. Pertinent negatives include no chest pain, myalgias, shortness of breath or wheezing. The symptoms are aggravated by lying down. Has not been tested for Covid or the Flu. This is patients 10th day with symptoms.  Lab Results  Component Value Date   NA 140 02/23/2021   K 4.6 02/23/2021   CO2 24 02/23/2021   GLUCOSE 88 02/23/2021   BUN 10 02/23/2021   CREATININE 0.61 02/23/2021   CALCIUM 9.3 02/23/2021   EGFR 107 02/23/2021   GFRNONAA 107 07/31/2018   Lab Results  Component Value Date   CHOL 283 (H) 02/23/2021   HDL 62 02/23/2021   LDLCALC 193 (H) 02/23/2021   TRIG 154 (H) 02/23/2021   CHOLHDL 3.9 07/31/2018   No results found for: TSH No results found for: HGBA1C Lab Results  Component Value Date   WBC 7.6 02/23/2021   HGB 13.2 02/23/2021   HCT 39.0 02/23/2021   MCV 88 02/23/2021   PLT 316 02/23/2021   No results found for: ALT, AST, GGT, ALKPHOS, BILITOT No results found for: 25OHVITD2, 25OHVITD3, VD25OH   Review of Systems  Constitutional:  Positive for chills and fever.  HENT:  Positive for congestion, ear pain (Left), postnasal drip and rhinorrhea. Negative for trouble swallowing.   Respiratory:  Positive for cough. Negative for shortness of breath and wheezing.   Cardiovascular:  Negative for chest pain.  Gastrointestinal:  Negative for diarrhea, nausea and vomiting.  Musculoskeletal:  Negative for myalgias.  Neurological:  Positive for headaches. Negative for dizziness.   Patient Active Problem List   Diagnosis Date Noted   Moderate mixed hyperlipidemia not  requiring statin therapy 02/23/2021   Reactive airway disease 02/23/2021   Personal history of colonic polyps    Encounter for screening colonoscopy    Benign neoplasm of cecum    History of migraine 07/23/2014   Menorrhagia 06/02/2014    No Known Allergies  Past Surgical History:  Procedure Laterality Date   ABLATION  2015   uterine   CATARACT EXTRACTION W/ INTRAOCULAR LENS IMPLANT Right 07/08/2019   [facility]   COLONOSCOPY WITH PROPOFOL N/A 08/30/2018   Procedure: COLONOSCOPY WITH PROPOFOL;  Surgeon: [name], MD;  Location: [place];  Service: Endoscopy;  Laterality: N/A;   COLONOSCOPY WITH PROPOFOL N/A 09/08/2019   Procedure: COLONOSCOPY WITH PROPOFOL;  Surgeon: [name], MD;  Location: [place];  Service: Endoscopy;  Laterality: N/A;   DILATION AND CURETTAGE OF UTERUS  2003   POLYPECTOMY  08/30/2018   Procedure: POLYPECTOMY;  Surgeon: [name], MD;  Location: [place];  Service: Endoscopy;;   POLYPECTOMY N/A 09/08/2019   Procedure: POLYPECTOMY;  Surgeon: [name], MD;  Location: [place];  Service: Endoscopy;  Laterality: N/A;    Social History   Tobacco Use   Smoking status: Former    Types: Cigarettes    Quit date: 1997    Years since quitting: 25.9   Smokeless tobacco: Never  Vaping Use   Vaping Use: Never used  Substance Use Topics   Alcohol use: Yes  Alcohol/week: 0.0 standard drinks    Comment: occasionally - may have 2-4 drinks/month   Drug use: No     Medication list has been reviewed and updated.  Current Meds  Medication Sig   albuterol (VENTOLIN HFA) 108 (90 Base) MCG/ACT inhaler Inhale 1-2 puffs into the lungs every 6 (six) hours as needed for wheezing or shortness of breath.   atorvastatin (LIPITOR) 10 MG tablet TAKE 1 TABLET BY MOUTH EVERY DAY   Cyanocobalamin (VITAMIN B-12 PO) Take by mouth daily.   Melatonin 5 MG TABS Take 5 mg by mouth at bedtime as needed.   Multiple Vitamin (MULTIVITAMIN)  tablet Take 1 tablet by mouth daily.   Omega-3 Fatty Acids (FISH OIL PO) Take by mouth daily.    PHQ 2/9 Scores 06/21/2021 02/23/2021 02/10/2020 02/28/2017  PHQ - 2 Score 0 0 0 0  PHQ- 9 Score 0 0 0 1    GAD 7 : Generalized Anxiety Score 06/21/2021 02/23/2021 02/10/2020  Nervous, Anxious, on Edge 0 0 0  Control/stop worrying 0 0 0  Worry too much - different things 0 0 0  Trouble relaxing 0 0 0  Restless 0 0 0  Easily annoyed or irritable 0 0 0  Afraid - awful might happen 0 0 0  Total GAD 7 Score 0 0 0  Anxiety Difficulty Not difficult at all - -    BP Readings from Last 3 Encounters:  06/21/21 122/74  02/23/21 130/88  02/10/20 120/84    Physical Exam Constitutional:      Appearance: She is well-developed.  HENT:     Right Ear: External ear normal. There is impacted cerumen.     Left Ear: External ear normal. There is impacted cerumen.     Nose:     Right Sinus: Maxillary sinus tenderness and frontal sinus tenderness present.     Left Sinus: Maxillary sinus tenderness and frontal sinus tenderness present.     Mouth/Throat:     Mouth: No oral lesions.     Pharynx: Uvula midline. Posterior oropharyngeal erythema present. No oropharyngeal exudate.  Cardiovascular:     Rate and Rhythm: Normal rate and regular rhythm.     Heart sounds: Normal heart sounds.  Pulmonary:     Breath sounds: Normal breath sounds. No wheezing or rales.  Lymphadenopathy:     Cervical: No cervical adenopathy.  Neurological:     Mental Status: She is alert and oriented to person, place, and time.    Wt Readings from Last 3 Encounters:  06/21/21 193 lb 3.2 oz (87.6 kg)  02/23/21 186 lb (84.4 kg)  02/10/20 182 lb (82.6 kg)    BP 122/74   Pulse 78   Temp 97.9 F (36.6 C) (Oral)   Ht _0  (1.651 m)   Wt 193 lb 3.2 oz (87.6 kg)   SpO2 98%   BMI 32.15 kg/m   Assessment and Plan: 1. Acute non-recurrent maxillary sinusitis Recommend decongestant - Flonase, sudafed or coricidin Continue  Claritin if helpful. OTC cough syrup at night Push fluids - amoxicillin-clavulanate (AUGMENTIN) 875-125 MG tablet; Take 1 tablet by mouth 2 (two) times daily for 10 days.  Dispense: 20 tablet; Refill: 0  2. Moderate mixed hyperlipidemia not requiring statin therapy Recommend follow up fasting labs with Dr. Ronnald Ramp in 4-6 weeks. Will refill Lipitor. - atorvastatin (LIPITOR) 10 MG tablet; Take 1 tablet (10 mg total) by mouth daily.  Dispense: 30 tablet; Refill: 2   Partially dictated using Editor, commissioning. Any errors  are unintentional.  Halina Maidens, MD [facility]  06/21/2021

## 2021-08-22 ENCOUNTER — Encounter: Payer: Self-pay | Admitting: Family Medicine

## 2021-08-22 ENCOUNTER — Ambulatory Visit: Payer: No Typology Code available for payment source | Admitting: Family Medicine

## 2021-08-22 ENCOUNTER — Other Ambulatory Visit: Payer: Self-pay

## 2021-08-22 VITALS — BP 130/80 | HR 88 | Ht 65.0 in | Wt 195.0 lb

## 2021-08-22 DIAGNOSIS — N939 Abnormal uterine and vaginal bleeding, unspecified: Secondary | ICD-10-CM

## 2021-08-22 DIAGNOSIS — E782 Mixed hyperlipidemia: Secondary | ICD-10-CM

## 2021-08-22 DIAGNOSIS — R109 Unspecified abdominal pain: Secondary | ICD-10-CM | POA: Diagnosis not present

## 2021-08-22 MED ORDER — ATORVASTATIN CALCIUM 10 MG PO TABS: 10.0000 mg | ORAL_TABLET | Freq: Every day | ORAL | 1 refills | Status: DC

## 2021-08-22 NOTE — Progress Notes (Signed)
Date:  08/22/2021   Name:  Jacqueline Pham   DOB:  [birth year]   MRN:  [id number]   Chief Complaint: Hyperlipidemia and Flank Pain (R) side dull ache x 2 weeks- constant)  Hyperlipidemia This is a chronic problem. The current episode started more than 1 year ago. The problem is controlled. Recent lipid tests were reviewed and are normal. She has no history of chronic renal disease, diabetes, hypothyroidism, liver disease, obesity or nephrotic syndrome. There are no known factors aggravating her hyperlipidemia. Pertinent negatives include no chest pain, focal sensory loss, leg pain or shortness of breath. Current antihyperlipidemic treatment includes statins. The current treatment provides mild improvement of lipids. There are no compliance problems.  Risk factors for coronary artery disease include dyslipidemia.  Flank Pain This is a new problem. The current episode started 1 to 4 weeks ago (4 weeks). The problem occurs constantly. The problem is unchanged. The pain is present in the lumbar spine. The pain is at a severity of 3/10. The pain is mild. The symptoms are aggravated by sitting. Associated symptoms include pelvic pain. Pertinent negatives include no abdominal pain, bladder incontinence, bowel incontinence, chest pain, dysuria, fever, headaches, leg pain, numbness, paresis, paresthesias, perianal numbness, tingling, weakness or weight loss.  Vaginal Bleeding The patient's primary symptoms include pelvic pain and vaginal bleeding. Primary symptoms comment: spotting. This is a new problem. The current episode started 1 to 4 weeks ago (intermitant 1-2x). The problem occurs intermittently. The pain is mild. The problem affects the right side. Associated symptoms include back pain, constipation and flank pain. Pertinent negatives include no abdominal pain, diarrhea, discolored urine, dysuria, fever, frequency, headaches, hematuria, joint pain, nausea, sore throat, urgency or vomiting.   Constipation This is a recurrent problem. The current episode started 1 to 4 weeks ago (2weeks). The problem is unchanged. Associated symptoms include back pain. Pertinent negatives include no abdominal pain, diarrhea, fever, hematochezia, melena, nausea, vomiting or weight loss. She has tried nothing for the symptoms.   Lab Results  Component Value Date   NA 140 02/23/2021   K 4.6 02/23/2021   CO2 24 02/23/2021   GLUCOSE 88 02/23/2021   BUN 10 02/23/2021   CREATININE 0.61 02/23/2021   CALCIUM 9.3 02/23/2021   EGFR 107 02/23/2021   GFRNONAA 107 07/31/2018   Lab Results  Component Value Date   CHOL 283 (H) 02/23/2021   HDL 62 02/23/2021   LDLCALC 193 (H) 02/23/2021   TRIG 154 (H) 02/23/2021   CHOLHDL 3.9 07/31/2018   No results found for: TSH No results found for: HGBA1C Lab Results  Component Value Date   WBC 7.6 02/23/2021   HGB 13.2 02/23/2021   HCT 39.0 02/23/2021   MCV 88 02/23/2021   PLT 316 02/23/2021   No results found for: ALT, AST, GGT, ALKPHOS, BILITOT No results found for: 25OHVITD2, 25OHVITD3, VD25OH   Review of Systems  Constitutional:  Negative for fever and weight loss.  HENT:  Negative for sore throat.   Respiratory:  Negative for shortness of breath.   Cardiovascular:  Negative for chest pain.  Gastrointestinal:  Positive for constipation. Negative for abdominal pain, bowel incontinence, diarrhea, hematochezia, melena, nausea and vomiting.  Genitourinary:  Positive for flank pain, pelvic pain and vaginal bleeding. Negative for bladder incontinence, dysuria, frequency, hematuria and urgency.  Musculoskeletal:  Positive for back pain. Negative for joint pain.  Neurological:  Negative for tingling, weakness, numbness, headaches and paresthesias.   Patient Active Problem List  Diagnosis Date Noted   Moderate mixed hyperlipidemia not requiring statin therapy 02/23/2021   Reactive airway disease 02/23/2021   Personal history of colonic polyps     Encounter for screening colonoscopy    Benign neoplasm of cecum    History of migraine 07/23/2014   Menorrhagia 06/02/2014    No Known Allergies  Past Surgical History:  Procedure Laterality Date   ABLATION  2015   uterine   CATARACT EXTRACTION W/ INTRAOCULAR LENS IMPLANT Right 07/08/2019   [facility]   COLONOSCOPY WITH PROPOFOL N/A 08/30/2018   Procedure: COLONOSCOPY WITH PROPOFOL;  Surgeon: [name], MD;  Location: [place];  Service: Endoscopy;  Laterality: N/A;   COLONOSCOPY WITH PROPOFOL N/A 09/08/2019   Procedure: COLONOSCOPY WITH PROPOFOL;  Surgeon: [name], MD;  Location: [place];  Service: Endoscopy;  Laterality: N/A;   DILATION AND CURETTAGE OF UTERUS  2003   POLYPECTOMY  08/30/2018   Procedure: POLYPECTOMY;  Surgeon: [name], MD;  Location: [place];  Service: Endoscopy;;   POLYPECTOMY N/A 09/08/2019   Procedure: POLYPECTOMY;  Surgeon: [name], MD;  Location: [place];  Service: Endoscopy;  Laterality: N/A;    Social History   Tobacco Use   Smoking status: Former    Types: Cigarettes    Quit date: 1997    Years since quitting: 26.0   Smokeless tobacco: Never  Vaping Use   Vaping Use: Never used  Substance Use Topics   Alcohol use: Yes    Alcohol/week: 0.0 standard drinks    Comment: occasionally - may have 2-4 drinks/month   Drug use: No     Medication list has been reviewed and updated.  Current Meds  Medication Sig   albuterol (VENTOLIN HFA) 108 (90 Base) MCG/ACT inhaler Inhale 1-2 puffs into the lungs every 6 (six) hours as needed for wheezing or shortness of breath.   atorvastatin (LIPITOR) 10 MG tablet Take 1 tablet (10 mg total) by mouth daily.   Cyanocobalamin (VITAMIN B-12 PO) Take by mouth daily.   Melatonin 5 MG TABS Take 5 mg by mouth at bedtime as needed.   Multiple Vitamin (MULTIVITAMIN) tablet Take 1 tablet by mouth daily.   Omega-3 Fatty Acids (FISH OIL PO) Take by mouth daily.    PHQ  2/9 Scores 06/21/2021 02/23/2021 02/10/2020 02/28/2017  PHQ - 2 Score 0 0 0 0  PHQ- 9 Score 0 0 0 1    GAD 7 : Generalized Anxiety Score 06/21/2021 02/23/2021 02/10/2020  Nervous, Anxious, on Edge 0 0 0  Control/stop worrying 0 0 0  Worry too much - different things 0 0 0  Trouble relaxing 0 0 0  Restless 0 0 0  Easily annoyed or irritable 0 0 0  Afraid - awful might happen 0 0 0  Total GAD 7 Score 0 0 0  Anxiety Difficulty Not difficult at all - -    BP Readings from Last 3 Encounters:  08/22/21 130/80  06/21/21 122/74  02/23/21 130/88    Physical Exam Vitals and nursing note reviewed. Exam conducted with a chaperone present.  Constitutional:      General: She is not in acute distress.    Appearance: She is not diaphoretic.  HENT:     Head: Normocephalic and atraumatic.     Right Ear: Tympanic membrane and external ear normal.     Left Ear: Tympanic membrane and external ear normal.     Nose: Nose normal. No congestion or rhinorrhea.  Eyes:  General:        Right eye: No discharge.        Left eye: No discharge.     Conjunctiva/sclera: Conjunctivae normal.     Pupils: Pupils are equal, round, and reactive to light.  Neck:     Thyroid: No thyromegaly.     Vascular: No JVD.  Cardiovascular:     Rate and Rhythm: Normal rate and regular rhythm.     Heart sounds: Normal heart sounds. No murmur heard.   No friction rub. No gallop.  Pulmonary:     Effort: Pulmonary effort is normal.     Breath sounds: Normal breath sounds. No wheezing or rhonchi.  Abdominal:     General: Bowel sounds are normal.     Palpations: Abdomen is soft. There is no hepatomegaly, splenomegaly or mass.     Tenderness: There is abdominal tenderness in the right lower quadrant, suprapubic area and left lower quadrant. There is no right CVA tenderness, left CVA tenderness, guarding or rebound.  Genitourinary:    Rectum: Normal. Guaiac result negative. No mass or tenderness.  Musculoskeletal:         General: Normal range of motion.     Cervical back: Normal range of motion and neck supple.  Lymphadenopathy:     Cervical: No cervical adenopathy.  Skin:    General: Skin is warm and dry.  Neurological:     Mental Status: She is alert.     Deep Tendon Reflexes: Reflexes are normal and symmetric.    Wt Readings from Last 3 Encounters:  08/22/21 195 lb (88.5 kg)  06/21/21 193 lb 3.2 oz (87.6 kg)  02/23/21 186 lb (84.4 kg)    BP 130/80    Pulse 88    Ht _0  (1.651 m)    Wt 195 lb (88.5 kg)    BMI 32.45 kg/m   Assessment and Plan:  1. Moderate mixed hyperlipidemia not requiring statin therapy Chronic.  Controlled.  Stable.  Continue atorvastatin 10 mg once a day.  Will check lipid panel for current status. - atorvastatin (LIPITOR) 10 MG tablet; Take 1 tablet (10 mg total) by mouth daily.  Dispense: 90 tablet; Refill: 1 - Lipid Panel With LDL/HDL Ratio  2. Flank pain New onset.  Right flank pain with some radiation to the back.  Onset approximately 4 weeks ago.  This is been persistent without increasing in pain.  Exam is soft with tenderness noted in the right lower suprapubic and left lower quadrants.  There is no rebound or guarding.  Patient is also noted some vaginal spotting as noted below.  And we will refer to [facility] for evaluation as she has seen [facility] in the past and for evaluation.  In the meantime we will obtain a CBC.  Patient does have her appendix but there is no tenderness on rectal exam or rebound tenderness. - Comprehensive Metabolic Panel (CMET) - Ambulatory referral to Gynecology - CBC with Differential/Platelet  3. Vaginal spotting New onset.  About 2 weeks ago noted some spotting.  Patient denies any chance of pregnancy as that her husband has had a vasectomy.  There is no CVA tenderness or guarding/rebound tenderness on examination.  Will check CBC and referral to gynecology has been placed. - Ambulatory referral to Gynecology - CBC with  Differential/Platelet

## 2021-08-23 LAB — COMPREHENSIVE METABOLIC PANEL
ALT: 16 IU/L (ref 0–32)
AST: 17 IU/L (ref 0–40)
Albumin/Globulin Ratio: 1.7 (ref 1.2–2.2)
Albumin: 4.5 g/dL (ref 3.8–4.9)
Alkaline Phosphatase: 102 IU/L (ref 44–121)
BUN/Creatinine Ratio: 21 (ref 9–23)
BUN: 12 mg/dL (ref 6–24)
Bilirubin Total: 0.4 mg/dL (ref 0.0–1.2)
CO2: 26 mmol/L (ref 20–29)
Calcium: 9.5 mg/dL (ref 8.7–10.2)
Chloride: 102 mmol/L (ref 96–106)
Creatinine, Ser: 0.57 mg/dL (ref 0.57–1.00)
Globulin, Total: 2.7 g/dL (ref 1.5–4.5)
Glucose: 102 mg/dL — ABNORMAL HIGH (ref 70–99)
Potassium: 4 mmol/L (ref 3.5–5.2)
Sodium: 141 mmol/L (ref 134–144)
Total Protein: 7.2 g/dL (ref 6.0–8.5)
eGFR: 109 mL/min/{1.73_m2} (ref >59)

## 2021-08-23 LAB — CBC WITH DIFFERENTIAL/PLATELET
Basophils Absolute: 0.1 10*3/uL (ref 0.0–0.2)
Basos: 1 %
EOS (ABSOLUTE): 0.3 10*3/uL (ref 0.0–0.4)
Eos: 4 %
Hematocrit: 39.4 % (ref 34.0–46.6)
Hemoglobin: 13.1 g/dL (ref 11.1–15.9)
Immature Grans (Abs): 0 10*3/uL (ref 0.0–0.1)
Immature Granulocytes: 0 %
Lymphocytes Absolute: 2.5 10*3/uL (ref 0.7–3.1)
Lymphs: 32 %
MCH: 30.1 pg (ref 26.6–33.0)
MCHC: 33.2 g/dL (ref 31.5–35.7)
MCV: 91 fL (ref 79–97)
Monocytes Absolute: 0.6 10*3/uL (ref 0.1–0.9)
Monocytes: 7 %
Neutrophils Absolute: 4.3 10*3/uL (ref 1.4–7.0)
Neutrophils: 56 %
Platelets: 260 10*3/uL (ref 150–450)
RBC: 4.35 x10E6/uL (ref 3.77–5.28)
RDW: 12.1 % (ref 11.7–15.4)
WBC: 7.8 10*3/uL (ref 3.4–10.8)

## 2021-08-23 LAB — LIPID PANEL WITH LDL/HDL RATIO
Cholesterol, Total: 223 mg/dL — ABNORMAL HIGH (ref 100–199)
HDL: 54 mg/dL (ref >39)
LDL Chol Calc (NIH): 122 mg/dL — ABNORMAL HIGH (ref 0–99)
LDL/HDL Ratio: 2.3 ratio (ref 0.0–3.2)
Triglycerides: 271 mg/dL — ABNORMAL HIGH (ref 0–149)
VLDL Cholesterol Cal: 47 mg/dL — ABNORMAL HIGH (ref 5–40)

## 2021-08-24 ENCOUNTER — Telehealth: Payer: Self-pay

## 2021-08-24 NOTE — Telephone Encounter (Signed)
[facility] referring for :right flank pain with vaginal spotting. MD only.Attempt to reach patient no answer, no voicemail.

## 2021-08-25 NOTE — Telephone Encounter (Signed)
Attempt to reach patient no answer, no voicemail.

## 2021-09-05 NOTE — Telephone Encounter (Signed)
Called and left voicemail for patient to call back to be scheduled. 

## 2021-09-05 NOTE — Telephone Encounter (Signed)
Patient is scheduled for 10/05/21 with [facility]S

## 2021-10-05 ENCOUNTER — Ambulatory Visit: Payer: No Typology Code available for payment source | Admitting: Obstetrics and Gynecology

## 2021-10-05 ENCOUNTER — Other Ambulatory Visit: Payer: Self-pay

## 2021-10-05 ENCOUNTER — Other Ambulatory Visit (HOSPITAL_COMMUNITY)
Admission: RE | Admit: 2021-10-05 | Discharge: 2021-10-05 | Disposition: A | Discharge status: Home / Self Care | Payer: No Typology Code available for payment source | Source: Ambulatory Visit | Attending: Obstetrics and Gynecology | Admitting: Obstetrics and Gynecology

## 2021-10-05 ENCOUNTER — Encounter: Payer: Self-pay | Admitting: Obstetrics and Gynecology

## 2021-10-05 VITALS — BP 110/70 | Ht 65.0 in | Wt 197.0 lb

## 2021-10-05 DIAGNOSIS — R350 Frequency of micturition: Secondary | ICD-10-CM | POA: Diagnosis not present

## 2021-10-05 DIAGNOSIS — Z124 Encounter for screening for malignant neoplasm of cervix: Secondary | ICD-10-CM | POA: Diagnosis present

## 2021-10-05 DIAGNOSIS — R102 Pelvic and perineal pain: Secondary | ICD-10-CM

## 2021-10-05 DIAGNOSIS — Z1231 Encounter for screening mammogram for malignant neoplasm of breast: Secondary | ICD-10-CM | POA: Diagnosis not present

## 2021-10-05 LAB — HM PAP SMEAR

## 2021-10-05 LAB — RESULTS CONSOLE HPV: CHL HPV: NEGATIVE

## 2021-10-05 NOTE — Progress Notes (Signed)
? ?Patient ID: Jacqueline Pham, female   DOB: [birth year], 55 y.o.   MRN: [id number] ? ?Reason for Consult: Annual Exam (Spotting in January lasted 2 days . Last period was in December 2022 lasted 1 day ) ?  ?Referred by Juline Patch, MD ? ?Subjective:  ?   ?HPI: ? ?Jacqueline Pham is a 56 y.o. female patient is here today for consultation regarding right-sided lower pelvic pain.  She reports that this pain has been present for approximately 3 months.  She describes it as a dull aching pain.  She reports that she has had vasomotor symptoms for about 2 months.  Including sleep disturbances irritability and vaginal dryness.  She reports that she had no vaginal bleeding in February.  In January she had a small amount of vaginal spotting.  She is also recently had 2 weeks of a clear vaginal discharge.  Additionally she complains of urinary frequency.  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  She sometimes has the urge to urinate multiple times in an hour.  Even if she just went to the bathroom she will need to go again.  She denies dysuria.  She reports that these symptoms have been present for about 3 months.  She has some difficulty holding her urine.  She also complains of constipation and fatigue. ? ?Gynecological History ? ?No LMP recorded. (Menstrual status: Irregular Periods). ?Menarche: 13 ?Menopause: n/a ? ?She denies passage of large clots ?She denies sensations of gushing or flooding of blood. ?She denies accidents where she bleeds through her clothing. ?She denies that she changes a saturated pad or tampon more frequently than every hour.  ?She denies that pain from her periods limits her activities. ? ?History of fibroids, polyps, or ovarian cysts? : no  ?History of PCOS? no ?Hstory of Endometriosis? no ?History of abnormal pap smears? no ?Have you had any sexually transmitted infections in the past? no ? ?Last Pap:  unknown ? ?She identifies as a female.  ? She denies dyspareunia. She denies postcoital bleeding.  ? ?Obstetrical History ?OB History  ?Gravida Para Term Preterm AB Living  ?'5 4 1 3 1 4  '$ ?SAB IAB Ectopic Multiple Live Births  ?           ?  ?# Outcome Date GA Lbr Len/2nd Weight Sex Delivery Anes PTL Lv  ?5 Term 2007 [redacted]w[redacted]d  F Vag-Spont     ?4 AB 2006          ?3 Preterm 1999 235w0d M Vag-Spont     ?2 Preterm 1997 2546w0d Vag-Spont     ?1 Preterm 1997 [id number]      ? ? ? ?Past Medical History:  ?Diagnosis Date  ? Asthma   ? Wears contact lenses   ? left eye only  ? ?Family History  ?Problem Relation Age of Onset  ? Liver cancer Mother   ? Cancer Mother   ? Heart attack Father   ? Cancer Father   ? Heart disease Father   ? Stroke Maternal Grandmother   ? Breast cancer Maternal Grandmother   ? Diabetes Maternal Grandfather   ? Heart disease Paternal Grandmother   ? ?Past Surgical History:  ?Procedure Laterality Date  ? ABLATION  2015  ? uterine  ? CATARACT EXTRACTION W/ INTRAOCULAR LENS IMPLANT Right 07/08/2019  ?  [facility]  ? COLONOSCOPY WITH PROPOFOL N/A 08/30/2018  ? Procedure: COLONOSCOPY WITH PROPOFOL;  Surgeon: [name], MD;  Location: [place];  Service: Endoscopy;  Laterality: N/A;  ? COLONOSCOPY WITH PROPOFOL N/A 09/08/2019  ? Procedure: COLONOSCOPY WITH PROPOFOL;  Surgeon: [name], MD;  Location: [place];  Service: Endoscopy;  Laterality: N/A;  ? Reading OF UTERUS  2003  ? POLYPECTOMY  08/30/2018  ? Procedure: POLYPECTOMY;  Surgeon: [name], MD;  Location: [place];  Service: Endoscopy;;  ? POLYPECTOMY N/A 09/08/2019  ? Procedure: POLYPECTOMY;  Surgeon: [name], MD;  Location: [place];  Service: Endoscopy;  Laterality: N/A;  ? ? ?Short Social History:  ?Social History  ? ?Tobacco Use  ? Smoking status: Former  ?  Types: Cigarettes  ?  Quit date: 1997  ?  Years since quitting: 26.2  ? Smokeless tobacco: Never  ?Substance Use Topics  ?  Alcohol use: Yes  ?  Alcohol/week: 0.0 standard drinks  ?  Comment: occasionally - may have 2-4 drinks/month  ? ? ?No Known Allergies ? ?Current Outpatient Medications  ?Medication Sig Dispense Refill  ? albuterol (VENTOLIN HFA) 108 (90 Base) MCG/ACT inhaler Inhale 1-2 puffs into the lungs every 6 (six) hours as needed for wheezing or shortness of breath. 6.7 g 11  ? atorvastatin (LIPITOR) 10 MG tablet Take 1 tablet (10 mg total) by mouth daily. 90 tablet 1  ? Cyanocobalamin (VITAMIN B-12 PO) Take by mouth daily.    ? Melatonin 5 MG TABS Take 5 mg by mouth at bedtime as needed.    ? Multiple Vitamin (MULTIVITAMIN) tablet Take 1 tablet by mouth daily.    ? Omega-3 Fatty Acids (FISH OIL PO) Take by mouth daily.    ? ?No current facility-administered medications for this visit.  ? ? ?Review of Systems  ?Constitutional: Negative for chills, fatigue, fever and unexpected weight change.  ?HENT: Negative for trouble swallowing.  ?Eyes: Negative for loss of vision.  ?Respiratory: Negative for cough, shortness of breath and wheezing.  ?Cardiovascular: Negative for chest pain, leg swelling, palpitations and syncope.  ?GI: Negative for abdominal pain, blood in stool, diarrhea, nausea and vomiting.  ?GU: Negative for difficulty urinating, dysuria, frequency and hematuria.  ?Musculoskeletal: Negative for back pain, leg pain and joint pain.  ?Skin: Negative for rash.  ?Neurological: Negative for dizziness, headaches, light-headedness, numbness and seizures.  ?Psychiatric: Negative for behavioral problem, confusion, depressed mood and sleep disturbance.   ? ?   ?Objective:  ?Objective  ? ?Vitals:  ? 10/05/21 0852  ?BP: 110/70  ?Weight: 197 lb (89.4 kg)  ?Height: '5\' 5"'$  (1.651 m)  ? ?Body mass index is 32.78 kg/m?. ? ?Physical Exam ?Vitals and nursing note reviewed. Exam conducted with a chaperone present.  ?Constitutional:   ?   Appearance: Normal appearance. She is well-developed.  ?HENT:  ?   Head: Normocephalic and atraumatic.   ?Eyes:  ?   Extraocular Movements: Extraocular movements intact.  ?   Pupils: Pupils are equal, round, and reactive to light.  ?Cardiovascular:  ?   Rate and Rhythm: Normal rate and regular rhythm.  ?Pulmonary:  ?   Effort: Pulmonary effort is normal. No respiratory distress.  ?   Breath sounds: Normal breath sounds.  ?Abdominal:  ?   General: Abdomen is flat.  ?   Palpations: Abdomen is soft.  ?Genitourinary: ?   Comments: External: Normal appearing vulva. No lesions noted.  ?Speculum examination: Normal appearing  cervix. No blood in the vaginal vault. No discharge.   ?Bimanual examination: Uterus midline, non-tender, normal in size, shape and contour.  No CMT. No adnexal masses.+ right adnexal tenderness. Pelvis not fixed. ? ?Breast exam: exam not performed ?Musculoskeletal:     ?   General: No signs of injury.  ?Skin: ?   General: Skin is warm and dry.  ?Neurological:  ?   Mental Status: She is alert and oriented to person, place, and time.  ?Psychiatric:     ?   Behavior: Behavior normal.     ?   Thought Content: Thought content normal.     ?   Judgment: Judgment normal.  ? ? ?Assessment/Plan:  ?  ?56 year old with complaints of right-sided lower and pelvic abdominal pain ?Pelvic ultrasound ordered we will follow-up with the patient after the pelvic ultrasound. ?Urinary frequency -encouraged patient to keep a bladder diary and provide her with a urinary hat.  Urine culture sent today.  [facility] AUGS handout given.  Will require additional follow-up for this issue. ?3. Cervical cancer screening- Pap smear today ? ?More than 20 minutes were spent face to face with the patient in the room, reviewing the medical record, labs and images, and coordinating care for the patient. The plan of management was discussed in detail and counseling was provided.  ?  ?Adrian Prows MD ?[place], [place] ?10/05/2021 ?9:36 AM ? ? ?

## 2021-10-05 NOTE — Patient Instructions (Signed)
Pelvic Pain, Female ?Pelvic pain is pain in your lower abdomen, below your belly button and between your hips. The pain may start suddenly (be acute), keep coming back (be recurring), or last a long time (become chronic). Pelvic pain that lasts longer than 6 months is considered chronic. ?Pelvic pain may affect your: ?Reproductive organs. ?Urinary system. ?Digestive tract. ?Musculoskeletal system. ?There are many potential causes of pelvic pain. Sometimes, the pain can be a result of digestive or urinary conditions, strained muscles or ligaments, or reproductive conditions. Sometimes the cause of pelvic pain is not known. ?Follow these instructions at home: ? ?Take over-the-counter and prescription medicines only as told by your health care provider. ?Rest as told by your health care provider. ?Do not have sex if it hurts. ?Keep a journal of your pelvic pain. Write down: ?When the pain started. ?Where the pain is located. ?What seems to make the pain better or worse, such as food or your monthly period (menstrual cycle). ?Any symptoms you have along with the pain. ?Keep all follow-up visits. This is important. ?Contact a health care provider if: ?Medicine does not help your pain, or your pain comes back. ?You have new symptoms. ?You have abnormal vaginal discharge or bleeding, including bleeding after menopause. ?You have a fever or chills. ?You are constipated. ?You have blood in your urine or stool (feces). ?You have foul-smelling urine. ?You feel weak or light-headed. ?Get help right away if: ?You have sudden severe pain. ?Your pain gets steadily worse. ?You have severe pain along with fever, nausea, vomiting, or excessive sweating. ?You lose consciousness. ?These symptoms may represent a serious problem that is an emergency. Do not wait to see if the symptoms will go away. Get medical help right away. Call your local emergency services (911 in the [place] Do not drive yourself to the hospital. ?Summary ?Pelvic  pain is pain in your lower abdomen, below your belly button and between your hips. ?There are many potential causes of pelvic pain. ?Keep a journal of your pelvic pain. ?This information is not intended to replace advice given to you by your health care provider. Make sure you discuss any questions you have with your health care provider. ?Document Revised: 11/16/2020 Document Reviewed: 11/16/2020 ?Elsevier Patient Education ? [place]. ? ?

## 2021-10-07 LAB — URINE CULTURE

## 2021-10-10 ENCOUNTER — Telehealth: Payer: Self-pay

## 2021-10-10 LAB — CYTOLOGY - PAP
Comment: NEGATIVE
High risk HPV: NEGATIVE

## 2021-10-10 NOTE — Telephone Encounter (Signed)
I contacted patient via phone left voicemail for patient to call back to scheduled.

## 2021-10-10 NOTE — Telephone Encounter (Signed)
-----   Message from Homero Fellers, MD sent at 10/10/2021 10:47 AM EDT ----- Please schedule for colposcopy and endometrial biopsy- might need to be with one of the new MDs.

## 2021-10-13 ENCOUNTER — Ambulatory Visit
Admission: RE | Admit: 2021-10-13 | Discharge: 2021-10-13 | Disposition: A | Discharge status: Home / Self Care | Payer: No Typology Code available for payment source | Source: Ambulatory Visit | Attending: Obstetrics and Gynecology | Admitting: Obstetrics and Gynecology

## 2021-10-13 ENCOUNTER — Other Ambulatory Visit: Payer: Self-pay

## 2021-10-13 DIAGNOSIS — R102 Pelvic and perineal pain unspecified side: Secondary | ICD-10-CM

## 2021-11-01 ENCOUNTER — Ambulatory Visit: Payer: No Typology Code available for payment source | Admitting: Family Medicine

## 2021-11-30 ENCOUNTER — Ambulatory Visit: Payer: No Typology Code available for payment source | Admitting: Obstetrics and Gynecology

## 2021-12-07 ENCOUNTER — Ambulatory Visit: Payer: No Typology Code available for payment source | Admitting: Obstetrics and Gynecology

## 2022-01-10 ENCOUNTER — Ambulatory Visit: Payer: No Typology Code available for payment source | Admitting: Family Medicine

## 2022-01-11 ENCOUNTER — Ambulatory Visit: Payer: No Typology Code available for payment source | Admitting: Family Medicine

## 2022-01-11 ENCOUNTER — Encounter: Payer: Self-pay | Admitting: Family Medicine

## 2022-01-11 VITALS — BP 124/70 | HR 76 | Ht 65.0 in | Wt 196.0 lb

## 2022-01-11 DIAGNOSIS — Z6830 Body mass index (BMI) 30.0-30.9, adult: Secondary | ICD-10-CM | POA: Diagnosis not present

## 2022-01-11 DIAGNOSIS — E782 Mixed hyperlipidemia: Secondary | ICD-10-CM

## 2022-01-11 NOTE — Progress Notes (Signed)
Date:  01/11/2022   Name:  Jacqueline Pham   DOB:  1965/10/29   MRN:  [id number]   Chief Complaint: Employment Physical  Patient is a 56 year old female who presents for a employment/insurance exam. The patient reports the following problems: none. Health maintenance has been reviewed up to date.      Lab Results  Component Value Date   NA 141 08/22/2021   K 4.0 08/22/2021   CO2 26 08/22/2021   GLUCOSE 102 (H) 08/22/2021   BUN 12 08/22/2021   CREATININE 0.57 08/22/2021   CALCIUM 9.5 08/22/2021   EGFR 109 08/22/2021   GFRNONAA 107 07/31/2018   Lab Results  Component Value Date   CHOL 223 (H) 08/22/2021   HDL 54 08/22/2021   LDLCALC 122 (H) 08/22/2021   TRIG 271 (H) 08/22/2021   CHOLHDL 3.9 07/31/2018   No results found for: "TSH" No results found for: "HGBA1C" Lab Results  Component Value Date   WBC 7.8 08/22/2021   HGB 13.1 08/22/2021   HCT 39.4 08/22/2021   MCV 91 08/22/2021   PLT 260 08/22/2021   Lab Results  Component Value Date   ALT 16 08/22/2021   AST 17 08/22/2021   ALKPHOS 102 08/22/2021   BILITOT 0.4 08/22/2021   No results found for: "25OHVITD2", "25OHVITD3", "VD25OH"   Review of Systems  Constitutional:  Negative for fatigue.  Respiratory:  Negative for shortness of breath and wheezing.   Cardiovascular:  Negative for chest pain, palpitations and leg swelling.  Gastrointestinal:  Negative for abdominal distention and abdominal pain.  Genitourinary:  Negative for difficulty urinating.  Musculoskeletal:  Negative for back pain.  Neurological:  Negative for headaches.    Patient Active Problem List   Diagnosis Date Noted   Moderate mixed hyperlipidemia not requiring statin therapy 02/23/2021   Reactive airway disease 02/23/2021   Personal history of colonic polyps    Encounter for screening colonoscopy    Benign neoplasm of cecum    History of migraine 07/23/2014   Menorrhagia 06/02/2014    No Known Allergies  Past Surgical  History:  Procedure Laterality Date   ABLATION  2015   uterine   CATARACT EXTRACTION W/ INTRAOCULAR LENS IMPLANT Right 07/08/2019   [facility]   COLONOSCOPY WITH PROPOFOL N/A 08/30/2018   Procedure: COLONOSCOPY WITH PROPOFOL;  Surgeon: [name], MD;  Location: [place];  Service: Endoscopy;  Laterality: N/A;   COLONOSCOPY WITH PROPOFOL N/A 09/08/2019   Procedure: COLONOSCOPY WITH PROPOFOL;  Surgeon: [name], MD;  Location: [place];  Service: Endoscopy;  Laterality: N/A;   DILATION AND CURETTAGE OF UTERUS  2003   POLYPECTOMY  08/30/2018   Procedure: POLYPECTOMY;  Surgeon: [name], MD;  Location: [place];  Service: Endoscopy;;   POLYPECTOMY N/A 09/08/2019   Procedure: POLYPECTOMY;  Surgeon: [name], MD;  Location: [place];  Service: Endoscopy;  Laterality: N/A;    Social History   Tobacco Use   Smoking status: Former    Types: Cigarettes    Quit date: 1997    Years since quitting: 26.4   Smokeless tobacco: Never  Vaping Use   Vaping Use: Never used  Substance Use Topics   Alcohol use: Yes    Alcohol/week: 0.0 standard drinks of alcohol    Comment: occasionally - may have 2-4 drinks/month   Drug use: No     Medication list has been reviewed and updated.  No outpatient medications have been marked  as taking for the 01/11/22 encounter (Office Visit) with Juline Patch, MD.       01/11/2022    7:56 AM 06/21/2021   11:09 AM 02/23/2021    8:31 AM 02/10/2020   11:04 AM  GAD 7 : Generalized Anxiety Score  Nervous, Anxious, on Edge 0 0 0 0  Control/stop worrying 0 0 0 0  Worry too much - different things 0 0 0 0  Trouble relaxing 0 0 0 0  Restless 0 0 0 0  Easily annoyed or irritable 0 0 0 0  Afraid - awful might happen 0 0 0 0  Total GAD 7 Score 0 0 0 0  Anxiety Difficulty Not difficult at all Not difficult at all         01/11/2022    7:56 AM  Depression screen PHQ 2/9  Decreased Interest 0  Down, Depressed,  Hopeless 0  PHQ - 2 Score 0  Altered sleeping 0  Tired, decreased energy 1  Change in appetite 0  Feeling bad or failure about yourself  0  Trouble concentrating 0  Moving slowly or fidgety/restless 0  Suicidal thoughts 0  PHQ-9 Score 1  Difficult doing work/chores Not difficult at all    BP Readings from Last 3 Encounters:  01/11/22 124/70  10/05/21 110/70  08/22/21 130/80    Physical Exam Vitals and nursing note reviewed. Exam conducted with a chaperone present.  Constitutional:      General: She is not in acute distress.    Appearance: She is not diaphoretic.  HENT:     Head: Normocephalic and atraumatic.     Right Ear: External ear normal.     Left Ear: External ear normal.     Nose: Nose normal.  Eyes:     General:        Right eye: No discharge.        Left eye: No discharge.     Conjunctiva/sclera: Conjunctivae normal.     Pupils: Pupils are equal, round, and reactive to light.  Neck:     Thyroid: No thyromegaly.     Vascular: No JVD.  Cardiovascular:     Rate and Rhythm: Normal rate and regular rhythm.     Heart sounds: Normal heart sounds. No murmur heard.    No friction rub. No gallop.  Pulmonary:     Effort: Pulmonary effort is normal.     Breath sounds: Normal breath sounds.  Abdominal:     General: Bowel sounds are normal.     Palpations: Abdomen is soft. There is no mass.     Tenderness: There is no abdominal tenderness. There is no guarding.  Musculoskeletal:        General: Normal range of motion.     Cervical back: Normal range of motion and neck supple.  Lymphadenopathy:     Cervical: No cervical adenopathy.  Skin:    General: Skin is warm and dry.  Neurological:     Mental Status: She is alert.     Deep Tendon Reflexes: Reflexes are normal and symmetric.     Wt Readings from Last 3 Encounters:  01/11/22 196 lb (88.9 kg)  10/05/21 197 lb (89.4 kg)  08/22/21 195 lb (88.5 kg)    BP 124/70   Pulse 76   Ht $R'5\' 5"'LC$  (1.651 m)   Wt 196  lb (88.9 kg)   BMI 32.62 kg/m   Assessment and Plan:  1. Moderate mixed hyperlipidemia not requiring statin therapy  Chronic.  Relatively controlled.  Stable.  Continue with dietary approach and has been given guidelines for low-cholesterol low triglyceride.  We will check lipid panel and A1c as directed by insurance criteria. - Lipid Panel With LDL/HDL Ratio - HgB A1c  2. BMI 30.0-30.9,adult Health risks of being over weight were discussed and patient was counseled on weight loss options and exercise.

## 2022-01-11 NOTE — Patient Instructions (Signed)

## 2022-01-12 ENCOUNTER — Other Ambulatory Visit: Payer: Self-pay

## 2022-01-12 DIAGNOSIS — E782 Mixed hyperlipidemia: Secondary | ICD-10-CM

## 2022-01-12 LAB — LIPID PANEL WITH LDL/HDL RATIO
Cholesterol, Total: 272 mg/dL — ABNORMAL HIGH (ref 100–199)
HDL: 60 mg/dL (ref >39)
LDL Chol Calc (NIH): 177 mg/dL — ABNORMAL HIGH (ref 0–99)
LDL/HDL Ratio: 3 ratio (ref 0.0–3.2)
Triglycerides: 191 mg/dL — ABNORMAL HIGH (ref 0–149)
VLDL Cholesterol Cal: 35 mg/dL (ref 5–40)

## 2022-01-12 LAB — HEMOGLOBIN A1C
Est. average glucose Bld gHb Est-mCnc: 108 mg/dL
Hgb A1c MFr Bld: 5.4 % (ref 4.8–5.6)

## 2022-01-12 MED ORDER — ATORVASTATIN CALCIUM 20 MG PO TABS: 20.0000 mg | ORAL_TABLET | Freq: Every day | ORAL | 1 refills | Status: DC

## 2022-02-10 NOTE — Telephone Encounter (Signed)
Letter received from [facility] following up on abnormal pap smear. Patient was notified by provider thru my chart to schedule colposcopy. Patient was scheduled 11/30/21, but had to reschedule. Patient No Show 12/07/21 appointment.

## 2022-02-16 ENCOUNTER — Encounter: Payer: Self-pay | Admitting: Obstetrics and Gynecology

## 2022-02-20 ENCOUNTER — Ambulatory Visit: Payer: No Typology Code available for payment source | Admitting: Family Medicine

## 2022-02-27 NOTE — Telephone Encounter (Signed)
Called and left voicemail for patient to call back to be scheduled. 

## 2022-03-09 NOTE — Telephone Encounter (Signed)
Called and left voicemail for patient to call back to be scheduled. 

## 2022-03-16 ENCOUNTER — Encounter: Payer: Self-pay | Admitting: Family Medicine

## 2022-08-08 ENCOUNTER — Other Ambulatory Visit
Admission: RE | Admit: 2022-08-08 | Discharge: 2022-08-08 | Disposition: A | Discharge status: Home / Self Care | Payer: No Typology Code available for payment source | Source: Home / Self Care | Attending: Family Medicine | Admitting: Family Medicine

## 2022-08-08 ENCOUNTER — Encounter: Payer: Self-pay | Admitting: Family Medicine

## 2022-08-08 ENCOUNTER — Ambulatory Visit
Admission: RE | Admit: 2022-08-08 | Discharge: 2022-08-08 | Disposition: A | Discharge status: Home / Self Care | Payer: No Typology Code available for payment source | Source: Ambulatory Visit | Attending: Family Medicine | Admitting: Family Medicine

## 2022-08-08 ENCOUNTER — Ambulatory Visit: Payer: No Typology Code available for payment source | Admitting: Family Medicine

## 2022-08-08 ENCOUNTER — Ambulatory Visit
Admission: RE | Admit: 2022-08-08 | Discharge: 2022-08-08 | Disposition: A | Discharge status: Home / Self Care | Payer: No Typology Code available for payment source | Attending: Family Medicine | Admitting: Family Medicine

## 2022-08-08 VITALS — BP 126/78 | HR 62 | Ht 65.0 in | Wt 194.0 lb

## 2022-08-08 DIAGNOSIS — R051 Acute cough: Secondary | ICD-10-CM

## 2022-08-08 DIAGNOSIS — J4520 Mild intermittent asthma, uncomplicated: Secondary | ICD-10-CM | POA: Diagnosis not present

## 2022-08-08 LAB — CBC WITH DIFFERENTIAL/PLATELET
Abs Immature Granulocytes: 0.02 10*3/uL (ref 0.00–0.07)
Basophils Absolute: 0.1 10*3/uL (ref 0.0–0.1)
Basophils Relative: 1 %
Eosinophils Absolute: 0.7 10*3/uL — ABNORMAL HIGH (ref 0.0–0.5)
Eosinophils Relative: 9 %
HCT: 40.6 % (ref 36.0–46.0)
Hemoglobin: 13.7 g/dL (ref 12.0–15.0)
Immature Granulocytes: 0 %
Lymphocytes Relative: 35 %
Lymphs Abs: 2.8 10*3/uL (ref 0.7–4.0)
MCH: 29.8 pg (ref 26.0–34.0)
MCHC: 33.7 g/dL (ref 30.0–36.0)
MCV: 88.3 fL (ref 80.0–100.0)
Monocytes Absolute: 0.6 10*3/uL (ref 0.1–1.0)
Monocytes Relative: 7 %
Neutro Abs: 3.7 10*3/uL (ref 1.7–7.7)
Neutrophils Relative %: 48 %
Platelets: 267 10*3/uL (ref 150–400)
RBC: 4.6 MIL/uL (ref 3.87–5.11)
RDW: 12.4 % (ref 11.5–15.5)
WBC: 7.9 10*3/uL (ref 4.0–10.5)
nRBC: 0 % (ref 0.0–0.2)

## 2022-08-08 LAB — POCT INFLUENZA A/B
Influenza A, POC: NEGATIVE
Influenza B, POC: NEGATIVE

## 2022-08-08 LAB — POC COVID19 BINAXNOW: SARS Coronavirus 2 Ag: NEGATIVE

## 2022-08-08 MED ORDER — ALBUTEROL SULFATE HFA 108 (90 BASE) MCG/ACT IN AERS: 1.0000 | INHALATION_SPRAY | Freq: Four times a day (QID) | RESPIRATORY_TRACT | 2 refills | Status: DC | PRN

## 2022-08-08 MED ORDER — IPRATROPIUM-ALBUTEROL 0.5-2.5 (3) MG/3ML IN SOLN
3.0000 mL | Freq: Once | RESPIRATORY_TRACT | Status: AC
  Administered 2022-08-08: 3 mL via RESPIRATORY_TRACT

## 2022-08-08 MED ORDER — AZITHROMYCIN 250 MG PO TABS: ORAL_TABLET | ORAL | 0 refills | Status: AC

## 2022-08-08 MED ORDER — PROMETHAZINE-DM 6.25-15 MG/5ML PO SYRP: 5.0000 mL | ORAL_SOLUTION | Freq: Four times a day (QID) | ORAL | 0 refills | Status: DC | PRN

## 2022-08-08 NOTE — Progress Notes (Signed)
Date:  08/08/2022   Name:  Jacqueline Pham   DOB:  23-Aug-1965   MRN:  [id number]   Chief Complaint: No chief complaint on file.  Asthma She complains of chest tightness, cough, frequent throat clearing, shortness of breath and wheezing. There is no difficulty breathing, hemoptysis, hoarse voice or sputum production. Primary symptoms comments: "Every now and then". This is a new problem. The current episode started 1 to 4 weeks ago. The problem has been waxing and waning. The cough is non-productive. Pertinent negatives include no dyspnea on exertion, fever or orthopnea. Her symptoms are aggravated by URI. Her symptoms are alleviated by beta-agonist and ipratropium (Patient was given a nebulization treatment of DuoNeb and there is increased air movement noted then prior to the administration.  Patient has been given wall with an AeroChamber to use 2 puffs for 6 hours.). She reports minimal improvement on treatment.    Lab Results  Component Value Date   NA 141 08/22/2021   K 4.0 08/22/2021   CO2 26 08/22/2021   GLUCOSE 102 (H) 08/22/2021   BUN 12 08/22/2021   CREATININE 0.57 08/22/2021   CALCIUM 9.5 08/22/2021   EGFR 109 08/22/2021   GFRNONAA 107 07/31/2018   Lab Results  Component Value Date   CHOL 272 (H) 01/11/2022   HDL 60 01/11/2022   LDLCALC 177 (H) 01/11/2022   TRIG 191 (H) 01/11/2022   CHOLHDL 3.9 07/31/2018   No results found for: "TSH" Lab Results  Component Value Date   HGBA1C 5.4 01/11/2022   Lab Results  Component Value Date   WBC 7.8 08/22/2021   HGB 13.1 08/22/2021   HCT 39.4 08/22/2021   MCV 91 08/22/2021   PLT 260 08/22/2021   Lab Results  Component Value Date   ALT 16 08/22/2021   AST 17 08/22/2021   ALKPHOS 102 08/22/2021   BILITOT 0.4 08/22/2021   No results found for: "25OHVITD2", "25OHVITD3", "VD25OH"   Review of Systems  Constitutional:  Negative for fever.  HENT:  Negative for hoarse voice.   Respiratory:  Positive for cough,  shortness of breath and wheezing. Negative for hemoptysis and sputum production.   Cardiovascular:  Negative for dyspnea on exertion.    Patient Active Problem List   Diagnosis Date Noted   Moderate mixed hyperlipidemia not requiring statin therapy 02/23/2021   Reactive airway disease 02/23/2021   Personal history of colonic polyps    Encounter for screening colonoscopy    Benign neoplasm of cecum    History of migraine 07/23/2014   Menorrhagia 06/02/2014    No Known Allergies  Past Surgical History:  Procedure Laterality Date   ABLATION  2015   uterine   CATARACT EXTRACTION W/ INTRAOCULAR LENS IMPLANT Right 07/08/2019   [facility]   COLONOSCOPY WITH PROPOFOL N/A 08/30/2018   Procedure: COLONOSCOPY WITH PROPOFOL;  Surgeon: [name], MD;  Location: [place];  Service: Endoscopy;  Laterality: N/A;   COLONOSCOPY WITH PROPOFOL N/A 09/08/2019   Procedure: COLONOSCOPY WITH PROPOFOL;  Surgeon: [name], MD;  Location: [place];  Service: Endoscopy;  Laterality: N/A;   DILATION AND CURETTAGE OF UTERUS  2003   POLYPECTOMY  08/30/2018   Procedure: POLYPECTOMY;  Surgeon: [name], MD;  Location: [place];  Service: Endoscopy;;   POLYPECTOMY N/A 09/08/2019   Procedure: POLYPECTOMY;  Surgeon: [name], MD;  Location: [place];  Service: Endoscopy;  Laterality: N/A;    Social History   Tobacco Use  Smoking status: Former    Types: Cigarettes    Quit date: 1997    Years since quitting: 27.0   Smokeless tobacco: Never  Vaping Use   Vaping Use: Never used  Substance Use Topics   Alcohol use: Yes    Alcohol/week: 0.0 standard drinks of alcohol    Comment: occasionally - may have 2-4 drinks/month   Drug use: No     Medication list has been reviewed and updated.  No outpatient medications have been marked as taking for the 08/08/22 encounter (Appointment) with Juline Patch, MD.       01/11/2022    7:56 AM 06/21/2021   11:09 AM  02/23/2021    8:31 AM 02/10/2020   11:04 AM  GAD 7 : Generalized Anxiety Score  Nervous, Anxious, on Edge 0 0 0 0  Control/stop worrying 0 0 0 0  Worry too much - different things 0 0 0 0  Trouble relaxing 0 0 0 0  Restless 0 0 0 0  Easily annoyed or irritable 0 0 0 0  Afraid - awful might happen 0 0 0 0  Total GAD 7 Score 0 0 0 0  Anxiety Difficulty Not difficult at all Not difficult at all         01/11/2022    7:56 AM 06/21/2021   11:09 AM 02/23/2021    8:31 AM  Depression screen PHQ 2/9  Decreased Interest 0 0 0  Down, Depressed, Hopeless 0 0 0  PHQ - 2 Score 0 0 0  Altered sleeping 0 0 0  Tired, decreased energy 1 0 0  Change in appetite 0 0 0  Feeling bad or failure about yourself  0 0 0  Trouble concentrating 0 0 0  Moving slowly or fidgety/restless 0 0 0  Suicidal thoughts 0 0 0  PHQ-9 Score 1 0 0  Difficult doing work/chores Not difficult at all Not difficult at all     BP Readings from Last 3 Encounters:  01/11/22 124/70  10/05/21 110/70  08/22/21 130/80    Physical Exam Vitals and nursing note reviewed.  HENT:     Right Ear: Tympanic membrane and ear canal normal.     Left Ear: Tympanic membrane and ear canal normal.     Nose: Nose normal.     Mouth/Throat:     Mouth: Mucous membranes are moist.  Cardiovascular:     Rate and Rhythm: Normal rate and regular rhythm.     Heart sounds: S1 normal. No murmur heard.    No systolic murmur is present.     No diastolic murmur is present.     No friction rub. No gallop. No S3 or S4 sounds.  Pulmonary:     Effort: Pulmonary effort is normal.     Breath sounds: Decreased air movement present. No decreased breath sounds, wheezing, rhonchi or rales.     Wt Readings from Last 3 Encounters:  01/11/22 196 lb (88.9 kg)  10/05/21 197 lb (89.4 kg)  08/22/21 195 lb (88.5 kg)    There were no vitals taken for this visit.  Assessment and Plan:  1. Acute cough Acute cough with diminished air movement on  auscultation.  This is consistent with reactive airway disease.  Patient has been given a DuoNeb inhalation which improved.  Patient will be put on albuterol inhaler to be used with AeroChamber.  We will obtain chest x-ray as well as a CBC CBC was noted to be normal awaiting chest  x-ray still.  Patient was given Promethazine DM.  This will be waiting for the chest x-ray we went on ahead and started azithromycin to 50 mg 2 tablets today followed by 1 a day for 4 days. - POCT Influenza A/B - POC COVID-19 - DG Chest 2 View; Future - ipratropium-albuterol (DUONEB) 0.5-2.5 (3) MG/3ML nebulizer solution 3 mL - promethazine-dextromethorphan (PROMETHAZINE-DM) 6.25-15 MG/5ML syrup; Take 5 mLs by mouth 4 (four) times daily as needed for cough.  Dispense: 118 mL; Refill: 0  2. Mild intermittent reactive airway disease without complication As noted above. - albuterol (VENTOLIN HFA) 108 (90 Base) MCG/ACT inhaler; Inhale 1-2 puffs into the lungs every 6 (six) hours as needed for wheezing or shortness of breath.  Dispense: 6.7 g; Refill: 2 - ipratropium-albuterol (DUONEB) 0.5-2.5 (3) MG/3ML nebulizer solution 3 mL - azithromycin (ZITHROMAX) 250 MG tablet; Take 2 tablets on day 1, then 1 tablet daily on days 2 through 5  Dispense: 6 tablet; Refill: 0    Otilio Miu, MD

## 2022-08-08 NOTE — Patient Instructions (Signed)
Why follow it? Research shows. Those who follow the Mediterranean diet have a reduced risk of heart disease  The diet is associated with a reduced incidence of Parkinson's and Alzheimer's diseases People following the diet may have longer life expectancies and lower rates of chronic diseases  The Dietary Guidelines for Americans recommends the Mediterranean diet as an eating plan to promote health and prevent disease  What Is the Mediterranean Diet?  Healthy eating plan based on typical foods and recipes of Mediterranean-style cooking The diet is primarily a plant based diet; these foods should make up a majority of meals   Starches - Plant based foods should make up a majority of meals - They are an important sources of vitamins, minerals, energy, antioxidants, and fiber - Choose whole grains, foods high in fiber and minimally processed items  - Typical grain sources include wheat, oats, barley, corn, brown rice, bulgar, farro, millet, polenta, couscous  - Various types of beans include chickpeas, lentils, fava beans, black beans, white beans   Fruits  Veggies - Large quantities of antioxidant rich fruits & veggies; 6 or more servings  - Vegetables can be eaten raw or lightly drizzled with oil and cooked  - Vegetables common to the traditional Mediterranean Diet include: artichokes, arugula, beets, broccoli, brussel sprouts, cabbage, carrots, celery, collard greens, cucumbers, eggplant, kale, leeks, lemons, lettuce, mushrooms, okra, onions, peas, peppers, potatoes, pumpkin, radishes, rutabaga, shallots, spinach, sweet potatoes, turnips, zucchini - Fruits common to the Mediterranean Diet include: apples, apricots, avocados, cherries, clementines, dates, figs, grapefruits, grapes, melons, nectarines, oranges, peaches, pears, pomegranates, strawberries, tangerines  Fats - Replace butter and margarine with healthy oils, such as olive oil, canola oil, and tahini  - Limit nuts to no more than a  handful a day  - Nuts include walnuts, almonds, pecans, pistachios, pine nuts  - Limit or avoid candied, honey roasted or heavily salted nuts - Olives are central to the [employer] - can be eaten whole or used in a variety of dishes   Meats Protein - Limiting red meat: no more than a few times a month - When eating red meat: choose lean cuts and keep the portion to the size of deck of cards - Eggs: approx. 0 to 4 times a week  - Fish and lean poultry: at least 2 a week  - Healthy protein sources include, chicken, [place], lean beef, lamb - Increase intake of seafood such as tuna, salmon, trout, mackerel, shrimp, scallops - Avoid or limit high fat processed meats such as sausage and bacon  Dairy - Include moderate amounts of low fat dairy products  - Focus on healthy dairy such as fat free yogurt, skim milk, low or reduced fat cheese - Limit dairy products higher in fat such as whole or 2% milk, cheese, ice cream  Alcohol - Moderate amounts of red wine is ok  - No more than 5 oz daily for women (all ages) and men older than age 56  - No more than 10 oz of wine daily for men younger than 36  Other - Limit sweets and other desserts  - Use herbs and spices instead of salt to flavor foods  - Herbs and spices common to the traditional Mediterranean Diet include: basil, bay leaves, chives, cloves, cumin, fennel, garlic, lavender, marjoram, mint, oregano, parsley, pepper, rosemary, sage, savory, sumac, tarragon, thyme   It's not just a diet, it's a lifestyle:  The Mediterranean diet includes lifestyle factors typical of those in the  region  Foods, drinks and meals are best eaten with others and savored Daily physical activity is important for overall good health This could be strenuous exercise like running and aerobics This could also be more leisurely activities such as walking, housework, yard-work, or taking the stairs Moderation is the key; a balanced and healthy diet accommodates most  foods and drinks Consider portion sizes and frequency of consumption of certain foods   Meal Ideas & Options:  Breakfast:  Whole wheat toast or whole wheat English muffins with peanut butter & hard boiled egg Steel cut oats topped with apples & cinnamon and skim milk  Fresh fruit: banana, strawberries, melon, berries, peaches  Smoothies: strawberries, bananas, greek yogurt, peanut butter Low fat greek yogurt with blueberries and granola  Egg white omelet with spinach and mushrooms Breakfast couscous: whole wheat couscous, apricots, skim milk, cranberries  Sandwiches:  Hummus and grilled vegetables (peppers, zucchini, squash) on whole wheat bread   Grilled chicken on whole wheat pita with lettuce, tomatoes, cucumbers or tzatziki  Tuna salad on whole wheat bread: tuna salad made with greek yogurt, olives, red peppers, capers, green onions Garlic rosemary lamb pita: lamb sauted with garlic, rosemary, salt & pepper; add lettuce, cucumber, greek yogurt to pita - flavor with lemon juice and black pepper  Seafood:  Mediterranean grilled salmon, seasoned with garlic, basil, parsley, lemon juice and black pepper Shrimp, lemon, and spinach whole-grain pasta salad made with low fat greek yogurt  Seared scallops with lemon orzo  Seared tuna steaks seasoned salt, pepper, coriander topped with tomato mixture of olives, tomatoes, olive oil, minced garlic, parsley, green onions and cappers  Meats:  Herbed greek chicken salad with kalamata olives, cucumber, feta  Red bell peppers stuffed with spinach, bulgur, lean ground beef (or lentils) & topped with feta   Kebabs: skewers of chicken, tomatoes, onions, zucchini, squash  Turkey burgers: made with red onions, mint, dill, lemon juice, feta cheese topped with roasted red peppers Vegetarian Cucumber salad: cucumbers, artichoke hearts, celery, red onion, feta cheese, tossed in olive oil & lemon juice  Hummus and whole grain pita points with a greek salad  (lettuce, tomato, feta, olives, cucumbers, red onion) Lentil soup with celery, carrots made with vegetable broth, garlic, salt and pepper  Tabouli salad: parsley, bulgur, mint, scallions, cucumbers, tomato, radishes, lemon juice, olive oil, salt and pepper.      

## 2022-08-11 ENCOUNTER — Ambulatory Visit: Payer: No Typology Code available for payment source | Admitting: Family Medicine

## 2022-09-08 ENCOUNTER — Ambulatory Visit: Payer: No Typology Code available for payment source | Admitting: Family Medicine

## 2022-09-08 ENCOUNTER — Encounter: Payer: Self-pay | Admitting: Family Medicine

## 2022-09-08 VITALS — BP 120/76 | HR 80 | Ht 65.0 in | Wt 193.0 lb

## 2022-09-08 DIAGNOSIS — E782 Mixed hyperlipidemia: Secondary | ICD-10-CM

## 2022-09-08 DIAGNOSIS — J4520 Mild intermittent asthma, uncomplicated: Secondary | ICD-10-CM

## 2022-09-08 MED ORDER — ATORVASTATIN CALCIUM 20 MG PO TABS: 20.0000 mg | ORAL_TABLET | Freq: Every day | ORAL | 1 refills | Status: DC

## 2022-09-08 MED ORDER — ALBUTEROL SULFATE HFA 108 (90 BASE) MCG/ACT IN AERS: 1.0000 | INHALATION_SPRAY | Freq: Four times a day (QID) | RESPIRATORY_TRACT | 2 refills | Status: DC | PRN

## 2022-09-08 NOTE — Progress Notes (Signed)
Date:  09/08/2022   Name:  Jacqueline Pham   DOB:  Apr 14, 1966   MRN:  [id number]   Chief Complaint: Hyperlipidemia and Asthma  Hyperlipidemia This is a chronic problem. The current episode started more than 1 year ago. The problem is controlled. Recent lipid tests were reviewed and are normal. She has no history of chronic renal disease, diabetes, hypothyroidism, liver disease, obesity or nephrotic syndrome. There are no known factors aggravating her hyperlipidemia. Pertinent negatives include no chest pain, focal sensory loss, focal weakness, leg pain, myalgias or shortness of breath. Current antihyperlipidemic treatment includes statins. The current treatment provides moderate improvement of lipids. There are no compliance problems.   Asthma There is no cough, hoarse voice, shortness of breath or wheezing. This is a chronic problem. The problem occurs intermittently. The problem has been gradually improving. Pertinent negatives include no chest pain, dyspnea on exertion, ear congestion, ear pain, fever, myalgias, orthopnea, PND, rhinorrhea, sneezing or sore throat. Her past medical history is significant for asthma.    Lab Results  Component Value Date   NA 141 08/22/2021   K 4.0 08/22/2021   CO2 26 08/22/2021   GLUCOSE 102 (H) 08/22/2021   BUN 12 08/22/2021   CREATININE 0.57 08/22/2021   CALCIUM 9.5 08/22/2021   EGFR 109 08/22/2021   GFRNONAA 107 07/31/2018   Lab Results  Component Value Date   CHOL 272 (H) 01/11/2022   HDL 60 01/11/2022   LDLCALC 177 (H) 01/11/2022   TRIG 191 (H) 01/11/2022   CHOLHDL 3.9 07/31/2018   No results found for: "TSH" Lab Results  Component Value Date   HGBA1C 5.4 01/11/2022   Lab Results  Component Value Date   WBC 7.9 08/08/2022   HGB 13.7 08/08/2022   HCT 40.6 08/08/2022   MCV 88.3 08/08/2022   PLT 267 08/08/2022   Lab Results  Component Value Date   ALT 16 08/22/2021   AST 17 08/22/2021   ALKPHOS 102 08/22/2021   BILITOT 0.4  08/22/2021   No results found for: "25OHVITD2", "25OHVITD3", "VD25OH"   Review of Systems  Constitutional: Negative.  Negative for chills, fatigue, fever and unexpected weight change.  HENT:  Negative for congestion, ear discharge, ear pain, hoarse voice, rhinorrhea, sinus pressure, sneezing and sore throat.   Respiratory:  Negative for cough, choking, chest tightness, shortness of breath, wheezing and stridor.   Cardiovascular:  Negative for chest pain, dyspnea on exertion and PND.  Gastrointestinal:  Negative for abdominal pain and nausea.  Endocrine: Negative for polydipsia and polyuria.  Genitourinary:  Negative for difficulty urinating.  Musculoskeletal:  Negative for myalgias.  Skin:  Negative for rash.  Neurological:  Negative for focal weakness.  Psychiatric/Behavioral:  Negative for dysphoric mood. The patient is not nervous/anxious.     Patient Active Problem List   Diagnosis Date Noted   Moderate mixed hyperlipidemia not requiring statin therapy 02/23/2021   Reactive airway disease 02/23/2021   Personal history of colonic polyps    Encounter for screening colonoscopy    Benign neoplasm of cecum    History of migraine 07/23/2014   Menorrhagia 06/02/2014    No Known Allergies  Past Surgical History:  Procedure Laterality Date   ABLATION  2015   uterine   CATARACT EXTRACTION W/ INTRAOCULAR LENS IMPLANT Right 07/08/2019   [facility]   COLONOSCOPY WITH PROPOFOL N/A 08/30/2018   Procedure: COLONOSCOPY WITH PROPOFOL;  Surgeon: [name], MD;  Location: [place];  Service: Endoscopy;  Laterality:  N/A;   COLONOSCOPY WITH PROPOFOL N/A 09/08/2019   Procedure: COLONOSCOPY WITH PROPOFOL;  Surgeon: [name], MD;  Location: [place];  Service: Endoscopy;  Laterality: N/A;   DILATION AND CURETTAGE OF UTERUS  2003   POLYPECTOMY  08/30/2018   Procedure: POLYPECTOMY;  Surgeon: [name], MD;  Location: [place];  Service: Endoscopy;;   POLYPECTOMY  N/A 09/08/2019   Procedure: POLYPECTOMY;  Surgeon: [name], MD;  Location: [place];  Service: Endoscopy;  Laterality: N/A;    Social History   Tobacco Use   Smoking status: Former    Types: Cigarettes    Quit date: 1997    Years since quitting: 27.1   Smokeless tobacco: Never  Vaping Use   Vaping Use: Never used  Substance Use Topics   Alcohol use: Yes    Alcohol/week: 0.0 standard drinks of alcohol    Comment: occasionally - may have 2-4 drinks/month   Drug use: No     Medication list has been reviewed and updated.  Current Meds  Medication Sig   albuterol (VENTOLIN HFA) 108 (90 Base) MCG/ACT inhaler Inhale 1-2 puffs into the lungs every 6 (six) hours as needed for wheezing or shortness of breath.   atorvastatin (LIPITOR) 20 MG tablet Take 1 tablet (20 mg total) by mouth daily.   Cyanocobalamin (VITAMIN B-12 PO) Take by mouth daily.   Melatonin 5 MG TABS Take 5 mg by mouth at bedtime as needed.   Multiple Vitamin (MULTIVITAMIN) tablet Take 1 tablet by mouth daily.   Omega-3 Fatty Acids (FISH OIL PO) Take by mouth daily.       09/08/2022    8:03 AM 08/08/2022    4:31 PM 01/11/2022    7:56 AM 06/21/2021   11:09 AM  GAD 7 : Generalized Anxiety Score  Nervous, Anxious, on Edge 0 0 0 0  Control/stop worrying 0 0 0 0  Worry too much - different things 0 0 0 0  Trouble relaxing 0 0 0 0  Restless 0 0 0 0  Easily annoyed or irritable 0 0 0 0  Afraid - awful might happen 0 0 0 0  Total GAD 7 Score 0 0 0 0  Anxiety Difficulty Not difficult at all Not difficult at all Not difficult at all Not difficult at all       09/08/2022    8:03 AM 08/08/2022    4:31 PM 01/11/2022    7:56 AM  Depression screen PHQ 2/9  Decreased Interest 0 0 0  Down, Depressed, Hopeless 0 0 0  PHQ - 2 Score 0 0 0  Altered sleeping 0 0 0  Tired, decreased energy 0 0 1  Change in appetite 0 0 0  Feeling bad or failure about yourself  0 0 0  Trouble concentrating 0 0 0  Moving slowly  or fidgety/restless 0 0 0  Suicidal thoughts 0 0 0  PHQ-9 Score 0 0 1  Difficult doing work/chores Not difficult at all Not difficult at all Not difficult at all    BP Readings from Last 3 Encounters:  09/08/22 120/76  08/08/22 126/78  01/11/22 124/70    Physical Exam Vitals and nursing note reviewed. Exam conducted with a chaperone present.  Constitutional:      General: She is not in acute distress.    Appearance: She is not diaphoretic.  HENT:     Head: Normocephalic and atraumatic.     Right Ear: Tympanic membrane and external ear  normal.     Left Ear: Tympanic membrane and external ear normal.     Nose: Nose normal.     Mouth/Throat:     Mouth: Mucous membranes are moist.     Pharynx: No oropharyngeal exudate or posterior oropharyngeal erythema.  Eyes:     General:        Right eye: No discharge.        Left eye: No discharge.     Conjunctiva/sclera: Conjunctivae normal.     Pupils: Pupils are equal, round, and reactive to light.  Neck:     Thyroid: No thyromegaly.     Vascular: No JVD.  Cardiovascular:     Rate and Rhythm: Normal rate and regular rhythm.     Heart sounds: Normal heart sounds, S1 normal and S2 normal. No murmur heard.    No systolic murmur is present.     No diastolic murmur is present.     No friction rub. No gallop. No S3 or S4 sounds.  Pulmonary:     Effort: Pulmonary effort is normal.     Breath sounds: Normal breath sounds. No decreased breath sounds, wheezing, rhonchi or rales.  Abdominal:     General: Bowel sounds are normal.     Palpations: Abdomen is soft. There is no mass.     Tenderness: There is no abdominal tenderness. There is no guarding or rebound.  Musculoskeletal:        General: No deformity or signs of injury. Normal range of motion.     Cervical back: Normal range of motion and neck supple.  Lymphadenopathy:     Cervical: No cervical adenopathy.  Skin:    General: Skin is warm and dry.  Neurological:     Mental Status:  She is alert.     Deep Tendon Reflexes: Reflexes are normal and symmetric.     Wt Readings from Last 3 Encounters:  09/08/22 193 lb (87.5 kg)  08/08/22 194 lb (88 kg)  01/11/22 196 lb (88.9 kg)    BP 120/76   Pulse 80   Ht 5' 5"$  (1.651 m)   Wt 193 lb (87.5 kg)   SpO2 96%   BMI 32.12 kg/m   Assessment and Plan:  1. Mild intermittent reactive airway disease without complication Chronic.  Mild.  Intermittent.  Presently stable.  Exam without wheezes rales or rhonchi.  Patient is doing well with current regimen of albuterol 2 puffs every 6 hours as needed for wheezing. - albuterol (VENTOLIN HFA) 108 (90 Base) MCG/ACT inhaler; Inhale 1-2 puffs into the lungs every 6 (six) hours as needed for wheezing or shortness of breath.  Dispense: 6.7 g; Refill: 2  2. Moderate mixed hyperlipidemia not requiring statin therapy Chronic.  Controlled.  Stable.  In addition to dietary discretion patient is taking atorvastatin 20 mg once a day.  Will check lipid panel for current status of LDL and CMP 2 to evaluate for hepatotoxicity. - atorvastatin (LIPITOR) 20 MG tablet; Take 1 tablet (20 mg total) by mouth daily.  Dispense: 90 tablet; Refill: 1 - Lipid Panel With LDL/HDL Ratio - Comprehensive Metabolic Panel (CMET)    Otilio Miu, MD

## 2022-09-09 LAB — LIPID PANEL WITH LDL/HDL RATIO
Cholesterol, Total: 274 mg/dL — ABNORMAL HIGH (ref 100–199)
HDL: 63 mg/dL (ref >39)
LDL Chol Calc (NIH): 184 mg/dL — ABNORMAL HIGH (ref 0–99)
LDL/HDL Ratio: 2.9 ratio (ref 0.0–3.2)
Triglycerides: 150 mg/dL — ABNORMAL HIGH (ref 0–149)
VLDL Cholesterol Cal: 27 mg/dL (ref 5–40)

## 2022-09-09 LAB — COMPREHENSIVE METABOLIC PANEL
ALT: 19 IU/L (ref 0–32)
AST: 19 IU/L (ref 0–40)
Albumin/Globulin Ratio: 1.6 (ref 1.2–2.2)
Albumin: 4.4 g/dL (ref 3.8–4.9)
Alkaline Phosphatase: 100 IU/L (ref 44–121)
BUN/Creatinine Ratio: 15 (ref 9–23)
BUN: 10 mg/dL (ref 6–24)
Bilirubin Total: 0.7 mg/dL (ref 0.0–1.2)
CO2: 24 mmol/L (ref 20–29)
Calcium: 9.2 mg/dL (ref 8.7–10.2)
Chloride: 103 mmol/L (ref 96–106)
Creatinine, Ser: 0.67 mg/dL (ref 0.57–1.00)
Globulin, Total: 2.8 g/dL (ref 1.5–4.5)
Glucose: 92 mg/dL (ref 70–99)
Potassium: 4.2 mmol/L (ref 3.5–5.2)
Sodium: 141 mmol/L (ref 134–144)
Total Protein: 7.2 g/dL (ref 6.0–8.5)
eGFR: 104 mL/min/{1.73_m2} (ref >59)

## 2022-12-04 ENCOUNTER — Ambulatory Visit: Payer: No Typology Code available for payment source | Admitting: Family Medicine

## 2022-12-12 ENCOUNTER — Other Ambulatory Visit: Payer: Self-pay

## 2022-12-12 ENCOUNTER — Encounter: Payer: Self-pay | Admitting: Emergency Medicine

## 2022-12-12 ENCOUNTER — Emergency Department: Payer: No Typology Code available for payment source | Admitting: Certified Registered"

## 2022-12-12 ENCOUNTER — Observation Stay
Admission: EM | Admit: 2022-12-12 | Discharge: 2022-12-13 | Disposition: A | Discharge status: Home / Self Care | Payer: No Typology Code available for payment source | Attending: Internal Medicine | Admitting: Internal Medicine

## 2022-12-12 ENCOUNTER — Emergency Department: Payer: No Typology Code available for payment source

## 2022-12-12 ENCOUNTER — Encounter
Admission: EM | Disposition: A | Discharge status: Home / Self Care | Payer: Self-pay | Source: Home / Self Care | Attending: Emergency Medicine

## 2022-12-12 DIAGNOSIS — E669 Obesity, unspecified: Secondary | ICD-10-CM | POA: Diagnosis not present

## 2022-12-12 DIAGNOSIS — J020 Streptococcal pharyngitis: Secondary | ICD-10-CM | POA: Insufficient documentation

## 2022-12-12 DIAGNOSIS — Z79899 Other long term (current) drug therapy: Secondary | ICD-10-CM | POA: Diagnosis not present

## 2022-12-12 DIAGNOSIS — Z6839 Body mass index (BMI) 39.0-39.9, adult: Secondary | ICD-10-CM | POA: Diagnosis not present

## 2022-12-12 DIAGNOSIS — E785 Hyperlipidemia, unspecified: Secondary | ICD-10-CM | POA: Diagnosis present

## 2022-12-12 DIAGNOSIS — J36 Peritonsillar abscess: Secondary | ICD-10-CM

## 2022-12-12 DIAGNOSIS — J45909 Unspecified asthma, uncomplicated: Secondary | ICD-10-CM | POA: Diagnosis not present

## 2022-12-12 DIAGNOSIS — Z87891 Personal history of nicotine dependence: Secondary | ICD-10-CM | POA: Insufficient documentation

## 2022-12-12 DIAGNOSIS — R7301 Impaired fasting glucose: Secondary | ICD-10-CM

## 2022-12-12 DIAGNOSIS — J029 Acute pharyngitis, unspecified: Secondary | ICD-10-CM | POA: Diagnosis present

## 2022-12-12 HISTORY — DX: Peritonsillar abscess: J36

## 2022-12-12 HISTORY — PX: INCISION AND DRAINAGE OF PERITONSILLAR ABCESS: SHX6257

## 2022-12-12 LAB — COMPREHENSIVE METABOLIC PANEL
ALT: 17 U/L (ref 0–44)
ALT: 16 U/L (ref 0–44)
AST: 15 U/L (ref 15–41)
AST: 18 U/L (ref 15–41)
Albumin: 4 g/dL (ref 3.5–5.0)
Albumin: 3.5 g/dL (ref 3.5–5.0)
Alkaline Phosphatase: 89 U/L (ref 38–126)
Alkaline Phosphatase: 81 U/L (ref 38–126)
Anion gap: 8 (ref 5–15)
Anion gap: 8 (ref 5–15)
BUN: 9 mg/dL (ref 6–20)
BUN: 7 mg/dL (ref 6–20)
CO2: 27 mmol/L (ref 22–32)
CO2: 25 mmol/L (ref 22–32)
Calcium: 9.1 mg/dL (ref 8.9–10.3)
Calcium: 8.6 mg/dL — ABNORMAL LOW (ref 8.9–10.3)
Chloride: 101 mmol/L (ref 98–111)
Chloride: 104 mmol/L (ref 98–111)
Creatinine, Ser: 0.58 mg/dL (ref 0.44–1.00)
Creatinine, Ser: 0.55 mg/dL (ref 0.44–1.00)
GFR, Estimated: >60 mL/min (ref >60)
GFR, Estimated: >60 mL/min (ref >60)
Glucose, Bld: 104 mg/dL — ABNORMAL HIGH (ref 70–99)
Glucose, Bld: 140 mg/dL — ABNORMAL HIGH (ref 70–99)
Potassium: 4 mmol/L (ref 3.5–5.1)
Potassium: 3.8 mmol/L (ref 3.5–5.1)
Sodium: 136 mmol/L (ref 135–145)
Sodium: 137 mmol/L (ref 135–145)
Total Bilirubin: 1.3 mg/dL — ABNORMAL HIGH (ref 0.3–1.2)
Total Bilirubin: 1 mg/dL (ref 0.3–1.2)
Total Protein: 8.1 g/dL (ref 6.5–8.1)
Total Protein: 7.6 g/dL (ref 6.5–8.1)

## 2022-12-12 LAB — CBC WITH DIFFERENTIAL/PLATELET
Abs Immature Granulocytes: 0.05 10*3/uL (ref 0.00–0.07)
Abs Immature Granulocytes: 0.07 10*3/uL (ref 0.00–0.07)
Basophils Absolute: 0.1 10*3/uL (ref 0.0–0.1)
Basophils Absolute: 0 10*3/uL (ref 0.0–0.1)
Basophils Relative: 0 %
Basophils Relative: 0 %
Eosinophils Absolute: 0.1 10*3/uL (ref 0.0–0.5)
Eosinophils Absolute: 0 10*3/uL (ref 0.0–0.5)
Eosinophils Relative: 1 %
Eosinophils Relative: 0 %
HCT: 41.2 % (ref 36.0–46.0)
HCT: 37.7 % (ref 36.0–46.0)
Hemoglobin: 13.5 g/dL (ref 12.0–15.0)
Hemoglobin: 12.6 g/dL (ref 12.0–15.0)
Immature Granulocytes: 0 %
Immature Granulocytes: 1 %
Lymphocytes Relative: 9 %
Lymphocytes Relative: 5 %
Lymphs Abs: 1.3 10*3/uL (ref 0.7–4.0)
Lymphs Abs: 0.7 10*3/uL (ref 0.7–4.0)
MCH: 29.2 pg (ref 26.0–34.0)
MCH: 29.5 pg (ref 26.0–34.0)
MCHC: 32.8 g/dL (ref 30.0–36.0)
MCHC: 33.4 g/dL (ref 30.0–36.0)
MCV: 89 fL (ref 80.0–100.0)
MCV: 88.3 fL (ref 80.0–100.0)
Monocytes Absolute: 0.8 10*3/uL (ref 0.1–1.0)
Monocytes Absolute: 0.1 10*3/uL (ref 0.1–1.0)
Monocytes Relative: 6 %
Monocytes Relative: 1 %
Neutro Abs: 12.4 10*3/uL — ABNORMAL HIGH (ref 1.7–7.7)
Neutro Abs: 13.8 10*3/uL — ABNORMAL HIGH (ref 1.7–7.7)
Neutrophils Relative %: 84 %
Neutrophils Relative %: 93 %
Platelets: 290 10*3/uL (ref 150–400)
Platelets: 269 10*3/uL (ref 150–400)
RBC: 4.63 MIL/uL (ref 3.87–5.11)
RBC: 4.27 MIL/uL (ref 3.87–5.11)
RDW: 12.1 % (ref 11.5–15.5)
RDW: 12.2 % (ref 11.5–15.5)
WBC: 14.6 10*3/uL — ABNORMAL HIGH (ref 4.0–10.5)
WBC: 14.7 10*3/uL — ABNORMAL HIGH (ref 4.0–10.5)
nRBC: 0 % (ref 0.0–0.2)
nRBC: 0 % (ref 0.0–0.2)

## 2022-12-12 LAB — PROCALCITONIN: Procalcitonin: <0.1 ng/mL

## 2022-12-12 LAB — AEROBIC/ANAEROBIC CULTURE W GRAM STAIN (SURGICAL/DEEP WOUND)

## 2022-12-12 LAB — GROUP A STREP BY PCR: Group A Strep by PCR: DETECTED — AB

## 2022-12-12 LAB — LACTIC ACID, PLASMA
Lactic Acid, Venous: 0.8 mmol/L (ref 0.5–1.9)
Lactic Acid, Venous: 1.5 mmol/L (ref 0.5–1.9)
Lactic Acid, Venous: 0.8 mmol/L (ref 0.5–1.9)

## 2022-12-12 SURGERY — INCISION AND DRAINAGE, ABSCESS, PERITONSILLAR
Anesthesia: General | Site: Mouth | Laterality: Left

## 2022-12-12 MED ORDER — SODIUM CHLORIDE 0.9 % IV SOLN
3.0000 g | Freq: Four times a day (QID) | INTRAVENOUS | Status: DC
  Administered 2022-12-12 – 2022-12-13 (×3): 3 g via INTRAVENOUS
  Filled 2022-12-12 (×4): qty 8

## 2022-12-12 MED ORDER — ACETAMINOPHEN 650 MG RE SUPP: 650.0000 mg | Freq: Four times a day (QID) | RECTAL | Status: DC | PRN

## 2022-12-12 MED ORDER — SUCCINYLCHOLINE CHLORIDE 200 MG/10ML IV SOSY
PREFILLED_SYRINGE | INTRAVENOUS | Status: DC | PRN
  Administered 2022-12-12: 100 mg via INTRAVENOUS

## 2022-12-12 MED ORDER — SUCCINYLCHOLINE CHLORIDE 200 MG/10ML IV SOSY
PREFILLED_SYRINGE | INTRAVENOUS | Status: AC
  Filled 2022-12-12: qty 10

## 2022-12-12 MED ORDER — ACETAMINOPHEN 325 MG PO TABS
650.0000 mg | ORAL_TABLET | Freq: Four times a day (QID) | ORAL | Status: DC | PRN
  Administered 2022-12-12 – 2022-12-13 (×2): 650 mg via ORAL
  Filled 2022-12-12 (×2): qty 2

## 2022-12-12 MED ORDER — ROCURONIUM BROMIDE 100 MG/10ML IV SOLN
INTRAVENOUS | Status: DC | PRN
  Administered 2022-12-12: 10 mg via INTRAVENOUS

## 2022-12-12 MED ORDER — MIDAZOLAM HCL 2 MG/2ML IJ SOLN
INTRAMUSCULAR | Status: AC
  Filled 2022-12-12: qty 2

## 2022-12-12 MED ORDER — SODIUM CHLORIDE 0.9 % IV SOLN: INTRAVENOUS | Status: DC

## 2022-12-12 MED ORDER — DEXAMETHASONE SODIUM PHOSPHATE 10 MG/ML IJ SOLN
10.0000 mg | Freq: Three times a day (TID) | INTRAMUSCULAR | Status: DC
  Administered 2022-12-13 (×2): 10 mg via INTRAVENOUS
  Filled 2022-12-12 (×2): qty 1

## 2022-12-12 MED ORDER — LIDOCAINE HCL (CARDIAC) PF 100 MG/5ML IV SOSY
PREFILLED_SYRINGE | INTRAVENOUS | Status: DC | PRN
  Administered 2022-12-12: 80 mg via INTRAVENOUS

## 2022-12-12 MED ORDER — FENTANYL CITRATE (PF) 100 MCG/2ML IJ SOLN
INTRAMUSCULAR | Status: AC
  Filled 2022-12-12: qty 2

## 2022-12-12 MED ORDER — DEXAMETHASONE SODIUM PHOSPHATE 10 MG/ML IJ SOLN
10.0000 mg | Freq: Once | INTRAMUSCULAR | Status: AC
  Administered 2022-12-12: 10 mg via INTRAVENOUS
  Filled 2022-12-12: qty 1

## 2022-12-12 MED ORDER — GLYCOPYRROLATE 0.2 MG/ML IJ SOLN
INTRAMUSCULAR | Status: DC | PRN
  Administered 2022-12-12: .2 mg via INTRAVENOUS

## 2022-12-12 MED ORDER — BUPIVACAINE HCL (PF) 0.5 % IJ SOLN
INTRAMUSCULAR | Status: AC
  Filled 2022-12-12: qty 30

## 2022-12-12 MED ORDER — DEXMEDETOMIDINE HCL IN NACL 80 MCG/20ML IV SOLN
INTRAVENOUS | Status: DC | PRN
  Administered 2022-12-12: 4 ug via INTRAVENOUS

## 2022-12-12 MED ORDER — FENTANYL CITRATE (PF) 100 MCG/2ML IJ SOLN: 25.0000 ug | INTRAMUSCULAR | Status: DC | PRN

## 2022-12-12 MED ORDER — ACETAMINOPHEN 10 MG/ML IV SOLN: 1000.0000 mg | Freq: Once | INTRAVENOUS | Status: DC | PRN

## 2022-12-12 MED ORDER — SODIUM CHLORIDE 0.9 % IV BOLUS
1000.0000 mL | Freq: Once | INTRAVENOUS | Status: AC
  Administered 2022-12-12: 1000 mL via INTRAVENOUS

## 2022-12-12 MED ORDER — SUGAMMADEX SODIUM 200 MG/2ML IV SOLN
INTRAVENOUS | Status: DC | PRN
  Administered 2022-12-12: 200 mg via INTRAVENOUS

## 2022-12-12 MED ORDER — MIDAZOLAM HCL 2 MG/2ML IJ SOLN
INTRAMUSCULAR | Status: DC | PRN
  Administered 2022-12-12: 2 mg via INTRAVENOUS

## 2022-12-12 MED ORDER — ALBUTEROL SULFATE HFA 108 (90 BASE) MCG/ACT IN AERS
INHALATION_SPRAY | RESPIRATORY_TRACT | Status: DC | PRN
  Administered 2022-12-12: 2 via RESPIRATORY_TRACT

## 2022-12-12 MED ORDER — HYDROMORPHONE HCL 1 MG/ML IJ SOLN
INTRAMUSCULAR | Status: AC
  Filled 2022-12-12: qty 1

## 2022-12-12 MED ORDER — ALBUTEROL SULFATE (2.5 MG/3ML) 0.083% IN NEBU: 2.5000 mg | INHALATION_SOLUTION | RESPIRATORY_TRACT | Status: DC | PRN

## 2022-12-12 MED ORDER — OXYCODONE HCL 5 MG/5ML PO SOLN: 5.0000 mg | Freq: Once | ORAL | Status: DC | PRN

## 2022-12-12 MED ORDER — HYDROMORPHONE HCL 1 MG/ML IJ SOLN
INTRAMUSCULAR | Status: DC | PRN
  Administered 2022-12-12: .3 mg via INTRAVENOUS

## 2022-12-12 MED ORDER — IOHEXOL 300 MG/ML  SOLN
75.0000 mL | Freq: Once | INTRAMUSCULAR | Status: AC | PRN
  Administered 2022-12-12: 75 mL via INTRAVENOUS

## 2022-12-12 MED ORDER — PROPOFOL 10 MG/ML IV BOLUS
INTRAVENOUS | Status: AC
  Filled 2022-12-12: qty 20

## 2022-12-12 MED ORDER — PROPOFOL 10 MG/ML IV BOLUS
INTRAVENOUS | Status: DC | PRN
  Administered 2022-12-12: 140 mg via INTRAVENOUS

## 2022-12-12 MED ORDER — LIDOCAINE HCL (PF) 2 % IJ SOLN
INTRAMUSCULAR | Status: AC
  Filled 2022-12-12: qty 5

## 2022-12-12 MED ORDER — PROMETHAZINE HCL 25 MG/ML IJ SOLN: 6.2500 mg | INTRAMUSCULAR | Status: DC | PRN

## 2022-12-12 MED ORDER — CLINDAMYCIN PHOSPHATE 600 MG/50ML IV SOLN
INTRAVENOUS | Status: AC
  Filled 2022-12-12: qty 50

## 2022-12-12 MED ORDER — FENTANYL CITRATE (PF) 100 MCG/2ML IJ SOLN
INTRAMUSCULAR | Status: DC | PRN
  Administered 2022-12-12 (×2): 50 ug via INTRAVENOUS

## 2022-12-12 MED ORDER — CLINDAMYCIN PHOSPHATE 300 MG/2ML IJ SOLN
INTRAMUSCULAR | Status: DC | PRN
  Administered 2022-12-12: 600 mg via INTRAVENOUS

## 2022-12-12 MED ORDER — BUPIVACAINE HCL 0.5 % IJ SOLN
INTRAMUSCULAR | Status: DC | PRN
  Administered 2022-12-12: 4 mL

## 2022-12-12 MED ORDER — ONDANSETRON HCL 4 MG/2ML IJ SOLN
INTRAMUSCULAR | Status: DC | PRN
  Administered 2022-12-12: 4 mg via INTRAVENOUS

## 2022-12-12 MED ORDER — SODIUM CHLORIDE 0.9 % IV SOLN
3.0000 g | Freq: Once | INTRAVENOUS | Status: AC
  Administered 2022-12-12: 3 g via INTRAVENOUS
  Filled 2022-12-12: qty 8

## 2022-12-12 MED ORDER — ONDANSETRON HCL 4 MG/2ML IJ SOLN: 4.0000 mg | Freq: Four times a day (QID) | INTRAMUSCULAR | Status: DC | PRN

## 2022-12-12 MED ORDER — KETOROLAC TROMETHAMINE 15 MG/ML IJ SOLN
15.0000 mg | Freq: Once | INTRAMUSCULAR | Status: AC
  Administered 2022-12-12: 15 mg via INTRAVENOUS
  Filled 2022-12-12: qty 1

## 2022-12-12 MED ORDER — OXYCODONE HCL 5 MG PO TABS: 5.0000 mg | ORAL_TABLET | Freq: Once | ORAL | Status: DC | PRN

## 2022-12-12 MED ORDER — LACTATED RINGERS IV SOLN: INTRAVENOUS | Status: DC | PRN

## 2022-12-12 MED ORDER — DROPERIDOL 2.5 MG/ML IJ SOLN: 0.6250 mg | Freq: Once | INTRAMUSCULAR | Status: DC | PRN

## 2022-12-12 MED ORDER — ONDANSETRON HCL 4 MG PO TABS: 4.0000 mg | ORAL_TABLET | Freq: Four times a day (QID) | ORAL | Status: DC | PRN

## 2022-12-12 SURGICAL SUPPLY — 17 items
ELECT CAUTERY BLADE TIP 2.5 (TIP) ×1
ELECT REM PT RETURN 9FT ADLT (ELECTROSURGICAL) ×1
ELECTRODE CAUTERY BLDE TIP 2.5 (TIP) ×1 IMPLANT
ELECTRODE REM PT RTRN 9FT ADLT (ELECTROSURGICAL) ×1 IMPLANT
GAUZE 4X4 16PLY ~~LOC~~+RFID DBL (SPONGE) ×1 IMPLANT
GLOVE BIO SURGEON STRL SZ7.5 (GLOVE) ×1 IMPLANT
GOWN STRL REUS W/ TWL LRG LVL3 (GOWN DISPOSABLE) ×2 IMPLANT
GOWN STRL REUS W/TWL LRG LVL3 (GOWN DISPOSABLE) ×2
HANDLE SUCTION POOLE (INSTRUMENTS) ×1 IMPLANT
MANIFOLD NEPTUNE II (INSTRUMENTS) ×1 IMPLANT
NDL SAFETY ECLIP 18X1.5 (MISCELLANEOUS) ×1 IMPLANT
NS IRRIG 500ML POUR BTL (IV SOLUTION) ×1 IMPLANT
PACK HEAD/NECK (MISCELLANEOUS) ×1 IMPLANT
SUCTION COAG ELEC 10 HAND CTRL (ELECTROSURGICAL) IMPLANT
SUCTION POOLE HANDLE (INSTRUMENTS) ×1
SWAB CULTURE AMIES ANAERIB BLU (MISCELLANEOUS) ×1 IMPLANT
TRAP FLUID SMOKE EVACUATOR (MISCELLANEOUS) ×1 IMPLANT

## 2022-12-12 NOTE — Consult Note (Signed)
Jacqueline Pham, [name] September 15, 1965 Jacqueline Herter, MD  Reason for Consult: Left peritonsillar abscess  HPI: 57 year old female with a history of 3-week intermittent sore throat.  Presented to the emergency room with severe sore throat on the left-hand side.  CT scan showed a left peritonsillar abscess.  Asked to evaluate patient for possible drainage.  Allergies: No Known Allergies  ROS: Review of systems normal other than 12 systems except per HPI.  PMH:  Past Medical History:  Diagnosis Date   Asthma    Wears contact lenses    left eye only    FH:  Family History  Problem Relation Age of Onset   Liver cancer Mother    Cancer Mother    Heart attack Father    Cancer Father    Heart disease Father    Stroke Maternal Grandmother    Breast cancer Maternal Grandmother    Diabetes Maternal Grandfather    Heart disease Paternal Grandmother     SH:  Social History   Socioeconomic History   Marital status: Married    Spouse name: Not on file   Number of children: Not on file   Years of education: Not on file   Highest education level: Not on file  Occupational History   Not on file  Tobacco Use   Smoking status: Former    Types: Cigarettes    Quit date: 1997    Years since quitting: 27.4   Smokeless tobacco: Never  Vaping Use   Vaping Use: Never used  Substance and Sexual Activity   Alcohol use: Yes    Alcohol/week: 0.0 standard drinks of alcohol    Comment: occasionally - may have 2-4 drinks/month   Drug use: No   Sexual activity: Yes  Other Topics Concern   Not on file  Social History Narrative   Not on file   Social Determinants of Health   Financial Resource Strain: Not on file  Food Insecurity: Not on file  Transportation Needs: Not on file  Physical Activity: Not on file  Stress: Not on file  Social Connections: Not on file  Intimate Partner Violence: Not on file    PSH:  Past Surgical History:  Procedure Laterality Date   ABLATION  2015    uterine   CATARACT EXTRACTION W/ INTRAOCULAR LENS IMPLANT Right 07/08/2019   [facility]   COLONOSCOPY WITH PROPOFOL N/A 08/30/2018   Procedure: COLONOSCOPY WITH PROPOFOL;  Surgeon: Midge Minium, MD;  Location: [facility];  Service: Endoscopy;  Laterality: N/A;   COLONOSCOPY WITH PROPOFOL N/A 09/08/2019   Procedure: COLONOSCOPY WITH PROPOFOL;  Surgeon: Midge Minium, MD;  Location: [facility];  Service: Endoscopy;  Laterality: N/A;   DILATION AND CURETTAGE OF UTERUS  2003   POLYPECTOMY  08/30/2018   Procedure: POLYPECTOMY;  Surgeon: Midge Minium, MD;  Location: [facility];  Service: Endoscopy;;   POLYPECTOMY N/A 09/08/2019   Procedure: POLYPECTOMY;  Surgeon: Midge Minium, MD;  Location: [facility];  Service: Endoscopy;  Laterality: N/A;    Physical  Exam: Patient awake and alert, potato voice on exam.  The external ears appear normal with anterior nose benign the oral cavity or pharynx shows exudative tonsillitis bilaterally, with obvious left peritonsillar abscess.  Heart had a regular rate and rhythm lungs clear to auscultation.   A/P: Left peritonsillar abscess I have reviewed the CT scan in detail, there is an obvious left peritonsillar abscess.  Long talk with patient and her husband risk and benefits of incision  and drainage including bleeding infection airway compromise and death were discussed.  They are eager to proceed.  She has been n.p.o. since yesterday.  The operating room has been called she has consented verbally this will be obtained in the emergency room by staff, and we will proceed with surgery ASAP.  Emergency room time approximately 45 minutes   Jacqueline Pham 12/12/2022 6:07 PM

## 2022-12-12 NOTE — Transfer of Care (Signed)
Immediate Anesthesia Transfer of Care Note  Patient: Jacqueline Pham  Procedure(s) Performed: INCISION AND DRAINAGE OF PERITONSILLAR ABCESS (Left: Mouth)  Patient Location: PACU  Anesthesia Type:General  Level of Consciousness: drowsy  Airway & Oxygen Therapy: Patient Spontanous Breathing and Patient connected to face mask oxygen  Post-op Assessment: Report given to RN and Post -op Vital signs reviewed and stable  Post vital signs: Reviewed  Last Vitals:  Vitals Value Taken Time  BP 137/43 12/12/22 1854  Temp 37.1 C 12/12/22 1854  Pulse 108 12/12/22 1857  Resp 17 12/12/22 1857  SpO2 99 % 12/12/22 1857  Vitals shown include unvalidated device data.  Last Pain:  Vitals:   12/12/22 1404  TempSrc:   PainSc: 10-Worst pain ever         Complications: No notable events documented.

## 2022-12-12 NOTE — ED Triage Notes (Signed)
Pt via POV from [facility] for evaluation for a peritonsillar abscess. Pt states her symptoms started 2-3 weeks ago but got worse last night. Pt is A&OX4 and NAD

## 2022-12-12 NOTE — H&P (Signed)
History and Physical    [name] WUJ:[id number] DOB: [birth year] DOA: 12/12/2022  PCP: Duanne Limerick, MD  Patient coming from: next care  I have personally briefly reviewed patient's old medical records in [facility]  Chief Complaint: sore throat x 2-3 weeks  HPI: Jacqueline Pham is a 57 y.o. female with medical history significant of  Hyperlipidemia, mild intermittent asthma, who present in referral from next care with 2-3 weeks of sore throat  associated with pain with swallowing and left sided neck pain and swelling. She noted no associated fever/chills/n/v/sob/chest pain / cough or inability to swallow.  She states at onset she visited urgent care was discharged with supportive care with NSAIDS,. She state her symptoms improved but over the last few days has gotten worse and last pm was very severe so she decided to present UC who referred patient to ED.  ED Course:  IN ED patient was noted to have temp 98.6, bp 150/72 hr 92 and rr 16 sat 98% on ra   Labs: Wbc 14.6 with increase pmn 12.4  ,lactic0.8 ,lr N a 136, K 4, cr 0.58  Tx ketoralc, decadron 10 , unasyn CT neck soft tissue: MPRESSION: 1. Findings compatible with tonsillitis and 2.7 cm left anterior/superior tonsillar abscess, detailed above. 2. Prominent and enhancing left retropharyngeal and upper cervical chain nodes, likely reactive given above findings.  Lab group A strep   Patient was evaluated by Dr Jenne Campus ENT and was take to OR  for Incision and drainage of left peritonsillar abscess. Patient s/p surgery no current complications noted.  Patient is admitted to hospitalist service for further care    Review of Systems: As per HPI otherwise 10 point review of systems negative.   Past Medical History:  Diagnosis Date   Asthma    Wears contact lenses    left eye only    Past Surgical History:  Procedure Laterality Date   ABLATION  2015   uterine   CATARACT EXTRACTION W/ INTRAOCULAR LENS  IMPLANT Right 07/08/2019   [facility]   COLONOSCOPY WITH PROPOFOL N/A 08/30/2018   Procedure: COLONOSCOPY WITH PROPOFOL;  Surgeon: Midge Minium, MD;  Location: [facility];  Service: Endoscopy;  Laterality: N/A;   COLONOSCOPY WITH PROPOFOL N/A 09/08/2019   Procedure: COLONOSCOPY WITH PROPOFOL;  Surgeon: Midge Minium, MD;  Location: [facility];  Service: Endoscopy;  Laterality: N/A;   DILATION AND CURETTAGE OF UTERUS  2003   POLYPECTOMY  08/30/2018   Procedure: POLYPECTOMY;  Surgeon: Midge Minium, MD;  Location: [facility];  Service: Endoscopy;;   POLYPECTOMY N/A 09/08/2019   Procedure: POLYPECTOMY;  Surgeon: Midge Minium, MD;  Location: [facility];  Service: Endoscopy;  Laterality: N/A;     reports that she quit smoking about 27 years ago. Her smoking use included cigarettes. She has never used smokeless tobacco. She reports current alcohol use. She reports that she does not use drugs.  No Known Allergies  Family History  Problem Relation Age of Onset   Liver cancer Mother    Cancer Mother    Heart attack Father    Cancer Father    Heart disease Father    Stroke Maternal Grandmother    Breast cancer Maternal Grandmother    Diabetes Maternal Grandfather    Heart disease Paternal Grandmother     Prior to Admission medications   Medication Sig Start Date End Date Taking? Authorizing Provider  albuterol (VENTOLIN HFA) 108 (90 Base) MCG/ACT inhaler Inhale  1-2 puffs into the lungs every 6 (six) hours as needed for wheezing or shortness of breath. 09/08/22   Duanne Limerick, MD  atorvastatin (LIPITOR) 20 MG tablet Take 1 tablet (20 mg total) by mouth daily. 09/08/22   Duanne Limerick, MD  Cyanocobalamin (VITAMIN B-12 PO) Take by mouth daily.    [provider]  Melatonin 5 MG TABS Take 5 mg by mouth at bedtime as needed.    [provider]  Multiple Vitamin (MULTIVITAMIN) tablet Take 1 tablet by mouth daily.    [provider]  Omega-3  Fatty Acids (FISH OIL PO) Take by mouth daily.    [provider]    Physical Exam: Vitals:   12/12/22 1900 12/12/22 1915 12/12/22 1930 12/12/22 1945  BP: (!) 124/49 (!) 129/41 (!) 124/43 (!) 110/57  Pulse: (!) 111 (!) 103 92 86  Resp: 16 15 13 17   Temp:   98.3 F (36.8 C)   TempSrc:      SpO2: 99% 92% 92% 98%  Weight:      Height:        Constitutional: NAD, calm, comfortable Vitals:   12/12/22 1900 12/12/22 1915 12/12/22 1930 12/12/22 1945  BP: (!) 124/49 (!) 129/41 (!) 124/43 (!) 110/57  Pulse: (!) 111 (!) 103 92 86  Resp: 16 15 13 17   Temp:   98.3 F (36.8 C)   TempSrc:      SpO2: 99% 92% 92% 98%  Weight:      Height:       Eyes: PERRL, lids and conjunctivae normal ENMT: Mucous membranes are moist. Posterior pharynx  swollen on left note area of clot formation s/p I and D Normal dentition. Left tonsillar area swollen and tender  Neck: normal, supple, no masses, no thyromegaly Respiratory: clear to auscultation bilaterally, no wheezing, no crackles. Normal respiratory effort. No accessory muscle use.  Cardiovascular: Regular rate and rhythm, no murmurs / rubs / gallops. No extremity edema. 2+ pedal pulses. No carotid bruits.  Abdomen: no tenderness, no masses palpated. No hepatosplenomegaly. Bowel sounds positive.  Musculoskeletal: no clubbing / cyanosis. No joint deformity upper and lower extremities. Good ROM, no contractures. Normal muscle tone.  Skin: no rashes, lesions, ulcers. No induration Neurologic: CN 2-12 grossly intact. Sensation intact,normal. Strength 5/5 in all 4.  Psychiatric: Normal judgment and insight. Alert and oriented x 3. Normal mood.    Labs on Admission: I have personally reviewed following labs and imaging studies  CBC: Recent Labs  Lab 12/12/22 1404  WBC 14.6*  NEUTROABS 12.4*  HGB 13.5  HCT 41.2  MCV 89.0  PLT 290   Basic Metabolic Panel: Recent Labs  Lab 12/12/22 1404  NA 136  K 4.0  CL 101  CO2 27  GLUCOSE 104*   BUN 9  CREATININE 0.58  CALCIUM 9.1   GFR: Estimated Creatinine Clearance: 90.6 mL/min (by C-G formula based on SCr of 0.58 mg/dL). Liver Function Tests: Recent Labs  Lab 12/12/22 1404  AST 15  ALT 17  ALKPHOS 89  BILITOT 1.3*  PROT 8.1  ALBUMIN 4.0   No results for input(s): "LIPASE", "AMYLASE" in the last 168 hours. No results for input(s): "AMMONIA" in the last 168 hours. Coagulation Profile: No results for input(s): "INR", "PROTIME" in the last 168 hours. Cardiac Enzymes: No results for input(s): "CKTOTAL", "CKMB", "CKMBINDEX", "TROPONINI" in the last 168 hours. BNP (last 3 results) No results for input(s): "PROBNP" in the last 8760 hours. HbA1C: No results for  input(s): "HGBA1C" in the last 72 hours. CBG: No results for input(s): "GLUCAP" in the last 168 hours. Lipid Profile: No results for input(s): "CHOL", "HDL", "LDLCALC", "TRIG", "CHOLHDL", "LDLDIRECT" in the last 72 hours. Thyroid Function Tests: No results for input(s): "TSH", "T4TOTAL", "FREET4", "T3FREE", "THYROIDAB" in the last 72 hours. Anemia Panel: No results for input(s): "VITAMINB12", "FOLATE", "FERRITIN", "TIBC", "IRON", "RETICCTPCT" in the last 72 hours. Urine analysis: No results found for: "COLORURINE", "APPEARANCEUR", "LABSPEC", "PHURINE", "GLUCOSEU", "HGBUR", "BILIRUBINUR", "KETONESUR", "PROTEINUR", "UROBILINOGEN", "NITRITE", "LEUKOCYTESUR"  Radiological Exams on Admission: CT Soft Tissue Neck W Contrast  Result Date: 12/12/2022 CLINICAL DATA:  Epiglottitis or tonsillitis suspected EXAM: CT NECK WITH CONTRAST TECHNIQUE: Multidetector CT imaging of the neck was performed using the standard protocol following the bolus administration of intravenous contrast. RADIATION DOSE REDUCTION: This exam was performed according to the departmental dose-optimization program which includes automated exposure control, adjustment of the mA and/or kV according to patient size and/or use of iterative reconstruction  technique. CONTRAST:  75mL OMNIPAQUE IOHEXOL 300 MG/ML  SOLN COMPARISON:  None Available. FINDINGS: Pharynx and larynx: Heterogeneous enhancement involving the tonsils, compatible tonsillitis. Approximately 2.7 x 2.7 x 2.2 cm peripherally enhancing lesion involving the anterior superior left tonsil, compatible with abscess. Surrounding edema which extends inferiorly into the upper left neck. Prevertebral edema without discrete prevertebral abscess. Normal appearance of the epiglottis and larynx. Salivary glands: No inflammation, mass, or stone. Thyroid: Subcentimeter left thyroid nodule which does not require further imaging follow-up per current guidelines (ref: J Am Coll Radiol. 2015 Feb;12(2): 143-50). Lymph nodes: Hyperenhancing and prominent left retropharyngeal and upper cervical chain nodes, likely reactive. Vascular: Negative. Limited intracranial: Negative. Visualized orbits: Negative. Mastoids and visualized paranasal sinuses: Mild paranasal sinus mucosal thickening. No mastoid effusions. Skeleton: No acute or aggressive process. Upper chest: Negative. IMPRESSION: 1. Findings compatible with tonsillitis and 2.7 cm left anterior/superior tonsillar abscess, detailed above. 2. Prominent and enhancing left retropharyngeal and upper cervical chain nodes, likely reactive given above findings. Electronically Signed   By: Feliberto Harts M.D.   On: 12/12/2022 16:22    EKG: Independently reviewed.  N/a  Assessment/Plan  Left peritonsillar abscess  -S/p Incision and drainage  -continue on unasyn  -continue on decadron -advance diet as tolerated per ENT  - supportive care with pain medications  - f/u on culture data -tailor abx  - plan for discharge in am if patient does well   HLD - resume statin on discharge   Asthma, mild intermittent -no exacerbation  -cont prn nebs   DVT prophylaxis:scd Code Status: full/ as discussed per patient wishes in event of cardiac arrest  Family Communication:  husband at bedside   Dollicia, Rohman (Spouse) [phone] (Home Phone)   Disposition Plan: patient  expected to be admitted less than 2 midnights  Consults called:  ENT dr Jenne Campus Admission status: med tele   Lurline Del MD [facility]  If 7PM-7AM, please contact night-coverage [email or web address] Password [facility]  12/12/2022, 7:55 PM

## 2022-12-12 NOTE — Op Note (Signed)
12/12/2022  6:51 PM    Jacqueline Pham  [id number]   Pre-Op Dx: Left peri tonsillar abscess  Post-op Dx: SAME  Proc: Incision and drainage of left peritonsillar abscess  Surg:  Jacqueline Pham  Anes:  GOT  EBL: Less than 20 cc  Comp: None  Findings: Approximately 5 to 7 cc in the left peritonsillar abscess pocket  Procedure: Mr. Was then found holding area taken the operating placed in supine position.  After general trach anesthesia the table was turned 45 degrees.  The patient was draped in usual fashion for tonsillectomy.  A mouthgag was inserted in the oral cavity and suspended.  Examination oropharynx showed uvular swelling and obvious left peritonsillar abscess.  A 5 cc syringe with an 18-gauge needle was then used to probe the peritonsillar region.  There was an obvious pus pocket which was encountered.  This was suctioned and sent for culture.  A 15 blade was used to make an incision along this guide needle path into the peritonsillar region.  A curved hemostat was then used to probe through this incision into the peritonsillar region.  There was an obvious pocket adjacent to the tonsil containing purulence.  This was suctioned free.  Approximately 5 to 7 cc was obtained.  Any loculations were then broken up using the curved hemostat.  Reaspiration with 18-gauge needle showed no evidence of other collection of peritonsillar pus.  With the pocket opened this was copiously irrigated with a solution of 600 mg of clindamycin and approximately 300 mg of saline.  With the area copiously irrigated, there was minimal oozing from the I&D.  A local anesthetic of 0.5% plain Marcaine was used to inject the peritonsillar region.  A total of 4 cc was used.  With this completed the patient was then returned to anesthesia where she was awakened in the operating type cover in stable condition.  Cultures: Left peritonsillar abscess  Dispo:   Good  Plan: Will admit for overnight observation with  IV antibiotics and steroids anticipate discharge to home in a.m. on oral medications.  The hospitalist service has been notified for overnight hospital admission coverage.  Jacqueline Pham  12/12/2022 6:51 PM

## 2022-12-12 NOTE — Anesthesia Preprocedure Evaluation (Signed)
Anesthesia Evaluation  Patient identified by MRN, date of birth, ID band Patient awake    Reviewed: Allergy & Precautions, H&P , NPO status , Patient's Chart, lab work & pertinent test results, reviewed documented beta blocker date and time   Airway Mallampati: II  TM Distance: >3 FB Neck ROM: full    Dental  (+) Teeth Intact   Pulmonary neg shortness of breath, asthma , former smoker   Pulmonary exam normal        Cardiovascular Exercise Tolerance: Good negative cardio ROS Normal cardiovascular exam Rhythm:regular Rate:Normal     Neuro/Psych negative neurological ROS  negative psych ROS   GI/Hepatic negative GI ROS, Neg liver ROS,,,  Endo/Other  negative endocrine ROS    Renal/GU negative Renal ROS  negative genitourinary   Musculoskeletal   Abdominal   Peds  Hematology negative hematology ROS (+)   Anesthesia Other Findings Past Medical History: No date: Asthma No date: Wears contact lenses     Comment:  left eye only Past Surgical History: 2015: ABLATION     Comment:  uterine 07/08/2019: CATARACT EXTRACTION W/ INTRAOCULAR LENS IMPLANT; Right     Comment:  [facility] 08/30/2018: COLONOSCOPY WITH PROPOFOL; N/A     Comment:  Procedure: COLONOSCOPY WITH PROPOFOL;  Surgeon: Midge Minium, MD;  Location: [facility];  Service:               Endoscopy;  Laterality: N/A; 09/08/2019: COLONOSCOPY WITH PROPOFOL; N/A     Comment:  Procedure: COLONOSCOPY WITH PROPOFOL;  Surgeon: Midge Minium, MD;  Location: [facility];  Service:               Endoscopy;  Laterality: N/A; 2003: DILATION AND CURETTAGE OF UTERUS 08/30/2018: POLYPECTOMY     Comment:  Procedure: POLYPECTOMY;  Surgeon: Midge Minium, MD;                Location: [facility];  Service: Endoscopy;; 09/08/2019: POLYPECTOMY; N/A     Comment:  Procedure: POLYPECTOMY;  Surgeon: Midge Minium, MD;                Location:  [facility];  Service: Endoscopy;                Laterality: N/A; BMI    Body Mass Index: 29.76 kg/m     Reproductive/Obstetrics negative OB ROS                             Anesthesia Physical Anesthesia Plan  ASA: 2 and emergent  Anesthesia Plan: General ETT   Post-op Pain Management:    Induction:   PONV Risk Score and Plan: 4 or greater  Airway Management Planned:   Additional Equipment:   Intra-op Plan:   Post-operative Plan:   Informed Consent: I have reviewed the patients History and Physical, chart, labs and discussed the procedure including the risks, benefits and alternatives for the proposed anesthesia with the patient or authorized representative who has indicated his/her understanding and acceptance.     Dental Advisory Given  Plan Discussed with: CRNA  Anesthesia Plan Comments:        Anesthesia Quick Evaluation

## 2022-12-12 NOTE — ED Provider Notes (Signed)
[facility] Provider Note    Event Date/Time   First MD Initiated Contact with Patient 12/12/22 1535     (approximate)   History   Abscess (Peritonsillar )   HPI  Jacqueline Pham is a 57 y.o. female with a past medical history of hyperlipidemia who presents today for evaluation of sore throat.  Patient reports that this has been ongoing since May 1, however it got better and then worsened again over the past couple of days.  Patient reports that she has had pain with swallowing, though she has been able to tolerate her own secretions.  She noticed a muffled voice today.  She denies fevers or chills.  She reports that she has left-sided neck pain as well.  She has not had any vomiting.  She reports that she went to an urgent care who sent her to the emergency department for further evaluation.  She reports that when her symptoms began several weeks ago she tested negative for COVID, flu, RSV, and strep.  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  Patient Active Problem List   Diagnosis Date Noted   Moderate mixed hyperlipidemia not requiring statin therapy 02/23/2021   Reactive airway disease 02/23/2021   Personal history of colonic polyps    Encounter for screening colonoscopy    Benign neoplasm of cecum    History of migraine 07/23/2014   Menorrhagia 06/02/2014          Physical Exam   Triage Vital Signs: ED Triage Vitals  Enc Vitals Group     BP 12/12/22 1359 (!) 150/72     Pulse Rate 12/12/22 1359 92     Resp 12/12/22 1359 16     Temp 12/12/22 1359 98.6 F (37 C)     Temp Source 12/12/22 1359 Oral     SpO2 12/12/22 1359 98 %     Weight 12/12/22 1404 190 lb (86.2 kg)     Height 12/12/22 1404 5\' 7"  (1.702 m)     Head Circumference --      Peak Flow --      Pain Score 12/12/22 1404 10     Pain Loc --      Pain Edu? --      Excl. in GC? --     Most recent vital signs: Vitals:   12/12/22 1359  BP: (!)  150/72  Pulse: 92  Resp: 16  Temp: 98.6 F (37 C)  SpO2: 98%    Physical Exam Vitals and nursing note reviewed.  Constitutional:      General: Awake and alert. No acute distress.    Appearance: Normal appearance. The patient is overweight.  HENT:     Head: Normocephalic and atraumatic.     Mouth: Mucous membranes are moist. Uvula deviated to the right. Left sided soft palate fluctuance present.  No tonsillar exudate.  No trismus.  Muffled voice present.  No sublingual swelling.  Left-sided tender cervical lymphadenopathy.  No nuchal rigidity Eyes:     General: PERRL. Normal EOMs        Right eye: No discharge.        Left eye: No discharge.     Conjunctiva/sclera: Conjunctivae normal.  Cardiovascular:     Rate and Rhythm: Normal rate and regular rhythm.     Pulses: Normal pulses.  Pulmonary:     Effort: Pulmonary effort is normal. No respiratory distress.     Breath sounds:  Normal breath sounds.  Abdominal:     Abdomen is soft. There is no abdominal tenderness. No rebound or guarding. No distention. Musculoskeletal:        General: No swelling. Normal range of motion.     Cervical back: Normal range of motion and neck supple.  Skin:    General: Skin is warm and dry.     Capillary Refill: Capillary refill takes less than 2 seconds.     Findings: No rash.  Neurological:     Mental Status: The patient is awake and alert.      ED Results / Procedures / Treatments   Labs (all labs ordered are listed, but only abnormal results are displayed) Labs Reviewed  GROUP A STREP BY PCR - Abnormal; Notable for the following components:      Result Value   Group A Strep by PCR DETECTED (*)    All other components within normal limits  COMPREHENSIVE METABOLIC PANEL - Abnormal; Notable for the following components:   Glucose, Bld 104 (*)    Total Bilirubin 1.3 (*)    All other components within normal limits  CBC WITH DIFFERENTIAL/PLATELET - Abnormal; Notable for the following  components:   WBC 14.6 (*)    Neutro Abs 12.4 (*)    All other components within normal limits  LACTIC ACID, PLASMA  LACTIC ACID, PLASMA     EKG     RADIOLOGY I independently reviewed and interpreted imaging and agree with radiologists findings.     PROCEDURES:  Critical Care performed:   Procedures   MEDICATIONS ORDERED IN ED: Medications  dexamethasone (DECADRON) injection 10 mg (10 mg Intravenous Given 12/12/22 1601)  ketorolac (TORADOL) 15 MG/ML injection 15 mg (15 mg Intravenous Given 12/12/22 1559)  Ampicillin-Sulbactam (UNASYN) 3 g in sodium chloride 0.9 % 100 mL IVPB (0 g Intravenous Stopped 12/12/22 1741)  sodium chloride 0.9 % bolus 1,000 mL (0 mLs Intravenous Stopped 12/12/22 1741)  iohexol (OMNIPAQUE) 300 MG/ML solution 75 mL (75 mLs Intravenous Contrast Given 12/12/22 1607)     IMPRESSION / MDM / ASSESSMENT AND PLAN / ED COURSE  I reviewed the triage vital signs and the nursing notes.   Differential diagnosis includes, but is not limited to, peritonsillar abscess, retropharyngeal abscess, strep pharyngitis, epiglottitis, tonsillitis.  Patient is awake and alert, hemodynamically stable and afebrile with a normal oxygen saturation on room air, though very uncomfortable in appearance.  She is uvular deviation to the right and soft palate fluctuance concerning for peritonsillar abscess.  She agreed to further workup.  IV was established and labs were obtained.  Patient has a leukocytosis to 14.  She agreed to CT soft tissue neck for further evaluation and identification of the extent of her abscess.  She was treated with Unasyn, Decadron, Toradol, and fluids.  Patient reported minimal improvement after medication management.  CT soft tissue neck reveals a 2.7 cm peritonsillar abscess.  This was discussed with Dr. Jenne Campus with ENT who feels that this would most appropriately be managed in the operating room.  Patient was consented for incision and drainage of her  peritonsillar abscess and brought directly to the OR from the ER.  Patient and her husband agree with plan of care.   Patient's presentation is most consistent with acute presentation with potential threat to life or bodily function.   Clinical Course as of 12/12/22 1826  Tue Dec 12, 2022  1700 Left voicemail for Dr. Jenne Campus as there was no answer, and secure chat  sent as well [JP]  1751 Discussed with Dr. Jenne Campus, plan to go to the OR tonight [JP]    Clinical Course User Index [JP] Masao Junker, Herb Grays, PA-C     FINAL CLINICAL IMPRESSION(S) / ED DIAGNOSES   Final diagnoses:  Peritonsillar abscess  Strep pharyngitis     Rx / DC Orders   ED Discharge Orders     None        Note:  This document was prepared using Dragon voice recognition software and may include unintentional dictation errors.   Jackelyn Hoehn, PA-C 12/12/22 Claria Dice    [name], MD 12/12/22 2352

## 2022-12-12 NOTE — Anesthesia Procedure Notes (Signed)
Procedure Name: Intubation Date/Time: 12/12/2022 6:28 PM  Performed by: Katherine Basset, CRNAPre-anesthesia Checklist: Patient identified, Emergency Drugs available, Suction available and Patient being monitored Patient Re-evaluated:Patient Re-evaluated prior to induction Oxygen Delivery Method: Circle system utilized Preoxygenation: Pre-oxygenation with 100% oxygen Induction Type: IV induction Laryngoscope Size: McGraph and 3 Grade View: Grade I Tube type: Oral Rae Tube size: 7.0 mm Number of attempts: 1 Airway Equipment and Method: Stylet and Oral airway Placement Confirmation: ETT inserted through vocal cords under direct vision, positive ETCO2 and breath sounds checked- equal and bilateral Secured at: 21 cm Tube secured with: Tape Dental Injury: Teeth and Oropharynx as per pre-operative assessment

## 2022-12-13 ENCOUNTER — Encounter: Payer: Self-pay | Admitting: Unknown Physician Specialty

## 2022-12-13 DIAGNOSIS — E669 Obesity, unspecified: Secondary | ICD-10-CM | POA: Diagnosis not present

## 2022-12-13 DIAGNOSIS — J36 Peritonsillar abscess: Secondary | ICD-10-CM | POA: Diagnosis not present

## 2022-12-13 DIAGNOSIS — E785 Hyperlipidemia, unspecified: Secondary | ICD-10-CM

## 2022-12-13 DIAGNOSIS — R7301 Impaired fasting glucose: Secondary | ICD-10-CM

## 2022-12-13 LAB — COMPREHENSIVE METABOLIC PANEL
ALT: 14 U/L (ref 0–44)
AST: 11 U/L — ABNORMAL LOW (ref 15–41)
Albumin: 3.2 g/dL — ABNORMAL LOW (ref 3.5–5.0)
Alkaline Phosphatase: 68 U/L (ref 38–126)
Anion gap: 9 (ref 5–15)
BUN: 9 mg/dL (ref 6–20)
CO2: 25 mmol/L (ref 22–32)
Calcium: 8.3 mg/dL — ABNORMAL LOW (ref 8.9–10.3)
Chloride: 103 mmol/L (ref 98–111)
Creatinine, Ser: 0.55 mg/dL (ref 0.44–1.00)
GFR, Estimated: >60 mL/min (ref >60)
Glucose, Bld: 140 mg/dL — ABNORMAL HIGH (ref 70–99)
Potassium: 3.6 mmol/L (ref 3.5–5.1)
Sodium: 137 mmol/L (ref 135–145)
Total Bilirubin: 1 mg/dL (ref 0.3–1.2)
Total Protein: 6.9 g/dL (ref 6.5–8.1)

## 2022-12-13 LAB — CBC
HCT: 35.7 % — ABNORMAL LOW (ref 36.0–46.0)
Hemoglobin: 12 g/dL (ref 12.0–15.0)
MCH: 29.7 pg (ref 26.0–34.0)
MCHC: 33.6 g/dL (ref 30.0–36.0)
MCV: 88.4 fL (ref 80.0–100.0)
Platelets: 263 10*3/uL (ref 150–400)
RBC: 4.04 MIL/uL (ref 3.87–5.11)
RDW: 11.9 % (ref 11.5–15.5)
WBC: 11.6 10*3/uL — ABNORMAL HIGH (ref 4.0–10.5)
nRBC: 0 % (ref 0.0–0.2)

## 2022-12-13 LAB — HIV ANTIBODY (ROUTINE TESTING W REFLEX): HIV Screen 4th Generation wRfx: NONREACTIVE

## 2022-12-13 LAB — LACTIC ACID, PLASMA: Lactic Acid, Venous: 0.6 mmol/L (ref 0.5–1.9)

## 2022-12-13 MED ORDER — PREDNISONE 10 MG (48) PO TBPK: ORAL_TABLET | ORAL | 0 refills | Status: DC

## 2022-12-13 MED ORDER — AMOXICILLIN-POT CLAVULANATE 875-125 MG PO TABS: 1.0000 | ORAL_TABLET | Freq: Two times a day (BID) | ORAL | 0 refills | Status: AC

## 2022-12-13 NOTE — Assessment & Plan Note (Signed)
BMI 30.07 with height and weight in computer

## 2022-12-13 NOTE — Hospital Course (Signed)
57 y.o. female with medical history significant of  Hyperlipidemia, mild intermittent asthma, who present in referral from next care with 2-3 weeks of sore throat  associated with pain with swallowing and left sided neck pain and swelling. She noted no associated fever/chills/n/v/sob/chest pain / cough or inability to swallow.  She states at onset she visited urgent care was discharged with supportive care with NSAIDS,. She state her symptoms improved but over the last few days has gotten worse and last pm was very severe so she decided to present to urgent care who referred patient to ED. Dr. Jenne Campus took her to the operating room on 5/21.  5 to 7 cc was obtained.  Patient was on Unasyn and Decadron.  5/22.  Patient feeling much better and able to swallow.  Patient received Unasyn at 11 AM prior to going home and a dose of Decadron.  ENT cleared to go home on Augmentin for 2 weeks and Sterapred DS taper.  Follow-up with ENT in 2 weeks.

## 2022-12-13 NOTE — Discharge Summary (Signed)
Physician Discharge Summary   Patient: Jacqueline Pham MRN: [id number] DOB: [birth year]  Admit date:     12/12/2022  Discharge date: 12/13/22  Discharge Physician: Alford Highland   PCP: Duanne Limerick, MD   Recommendations at discharge:   Follow-up PCP 5 days Follow-up Dr. Shirlee Latch ENT 2 weeks  Discharge Diagnoses: Principal Problem:   Peritonsillar abscess Active Problems:   Impaired fasting glucose   Obesity (BMI 30-39.9)   Hyperlipidemia    Hospital Course: 57 y.o. female with medical history significant of  Hyperlipidemia, mild intermittent asthma, who present in referral from next care with 2-3 weeks of sore throat  associated with pain with swallowing and left sided neck pain and swelling. She noted no associated fever/chills/n/v/sob/chest pain / cough or inability to swallow.  She states at onset she visited urgent care was discharged with supportive care with NSAIDS,. She state her symptoms improved but over the last few days has gotten worse and last pm was very severe so she decided to present to urgent care who referred patient to ED. Dr. Jenne Campus took her to the operating room on 5/21.  5 to 7 cc was obtained.  Patient was on Unasyn and Decadron.  5/22.  Patient feeling much better and able to swallow.  Patient received Unasyn at 11 AM prior to going home and a dose of Decadron.  ENT cleared to go home on Augmentin for 2 weeks and Sterapred DS taper.  Follow-up with ENT in 2 weeks.  Assessment and Plan: * Peritonsillar abscess Taken to the OR by Dr. Jenne Campus on 5/21.  Patient was on Unasyn and Decadron while here.  Cleared by ENT to go home on Augmentin for 2 weeks and Sterapred DS taper.  Impaired fasting glucose Likely glucose elevated with steroids  Obesity (BMI 30-39.9) BMI 30.07 with height and weight in computer  Hyperlipidemia Patient has not started her cholesterol medication yet.         Consultants: ENT Procedures performed: Peritonsillar abscess  drainage Disposition: Home Diet recommendation:  Regular diet DISCHARGE MEDICATION: Allergies as of 12/13/2022   No Known Allergies      Medication List     TAKE these medications    albuterol 108 (90 Base) MCG/ACT inhaler Commonly known as: VENTOLIN HFA Inhale 1-2 puffs into the lungs every 6 (six) hours as needed for wheezing or shortness of breath.   amoxicillin-clavulanate 875-125 MG tablet Commonly known as: AUGMENTIN Take 1 tablet by mouth 2 (two) times daily for 14 days.   atorvastatin 20 MG tablet Commonly known as: LIPITOR Take 1 tablet (20 mg total) by mouth daily.   FISH OIL PO Take by mouth daily.   melatonin 5 MG Tabs Take 5 mg by mouth at bedtime as needed.   multivitamin tablet Take 1 tablet by mouth daily.   predniSONE 10 MG (48) Tbpk tablet Commonly known as: STERAPRED UNI-PAK 48 TAB Take 6 tabs po daily for 2 days, take 5 tabs po daily for two days, continue to decrease one tablete every two days until complete.   VITAMIN B-12 PO Take by mouth daily.        Follow-up Information     Duanne Limerick, MD Follow up in 5 day(s).   Specialty: Family Medicine Contact information: [address] [place] [address] [phone]         Linus Salmons, MD. Schedule an appointment as soon as possible for a visit in 2 week(s).   Specialty: Otolaryngology Why: Follow up  in 2 weeks Contact information: [address] [place] [address] [place] [address] [phone]                Discharge Exam: Ceasar Mons Weights   12/12/22 1404 12/13/22 0530  Weight: 86.2 kg 87.1 kg   Physical Exam HENT:     Head: Normocephalic.     Mouth/Throat:     Comments: Erythema left tonsil. Eyes:     General: Lids are normal.     Conjunctiva/sclera: Conjunctivae normal.  Cardiovascular:     Rate and Rhythm: Normal rate and regular rhythm.     Heart sounds: Normal heart sounds, S1 normal and S2 normal.  Pulmonary:     Breath sounds: No  decreased breath sounds, wheezing, rhonchi or rales.  Abdominal:     Palpations: Abdomen is soft.     Tenderness: There is no abdominal tenderness.  Musculoskeletal:     Right lower leg: No swelling.     Left lower leg: No swelling.  Skin:    General: Skin is warm.     Findings: No rash.  Neurological:     Mental Status: She is alert and oriented to person, place, and time.      Condition at discharge: stable  The results of significant diagnostics from this hospitalization (including imaging, microbiology, ancillary and laboratory) are listed below for reference.   Imaging Studies: CT Soft Tissue Neck W Contrast  Result Date: 12/12/2022 CLINICAL DATA:  Epiglottitis or tonsillitis suspected EXAM: CT NECK WITH CONTRAST TECHNIQUE: Multidetector CT imaging of the neck was performed using the standard protocol following the bolus administration of intravenous contrast. RADIATION DOSE REDUCTION: This exam was performed according to the departmental dose-optimization program which includes automated exposure control, adjustment of the mA and/or kV according to patient size and/or use of iterative reconstruction technique. CONTRAST:  75mL OMNIPAQUE IOHEXOL 300 MG/ML  SOLN COMPARISON:  None Available. FINDINGS: Pharynx and larynx: Heterogeneous enhancement involving the tonsils, compatible tonsillitis. Approximately 2.7 x 2.7 x 2.2 cm peripherally enhancing lesion involving the anterior superior left tonsil, compatible with abscess. Surrounding edema which extends inferiorly into the upper left neck. Prevertebral edema without discrete prevertebral abscess. Normal appearance of the epiglottis and larynx. Salivary glands: No inflammation, mass, or stone. Thyroid: Subcentimeter left thyroid nodule which does not require further imaging follow-up per current guidelines (ref: J Am Coll Radiol. 2015 Feb;12(2): 143-50). Lymph nodes: Hyperenhancing and prominent left retropharyngeal and upper cervical chain  nodes, likely reactive. Vascular: Negative. Limited intracranial: Negative. Visualized orbits: Negative. Mastoids and visualized paranasal sinuses: Mild paranasal sinus mucosal thickening. No mastoid effusions. Skeleton: No acute or aggressive process. Upper chest: Negative. IMPRESSION: 1. Findings compatible with tonsillitis and 2.7 cm left anterior/superior tonsillar abscess, detailed above. 2. Prominent and enhancing left retropharyngeal and upper cervical chain nodes, likely reactive given above findings. Electronically Signed   By: Feliberto Harts M.D.   On: 12/12/2022 16:22    Microbiology: Results for orders placed or performed during the hospital encounter of 12/12/22  Group A Strep by PCR     Status: Abnormal   Collection Time: 12/12/22  4:28 PM   Specimen: Throat; Sterile Swab  Result Value Ref Range Status   Group A Strep by PCR DETECTED (A) NOT DETECTED Final    Comment: Performed at [facility], [address] [place], [place] [address]  Aerobic/Anaerobic Culture w Gram Stain (surgical/deep wound)     Status: None (Preliminary result)   Collection Time: 12/12/22  6:35 PM  Specimen: Tonsil, Left; ENT  Result Value Ref Range Status   Specimen Description   Final    ABSCESS Performed at [facility], [address], [place], [place] [address]    Special Requests   Final    LEFT TONSAIL Performed at [facility], [address] [place], [place] [address]    Gram Stain   Final    FEW WBC PRESENT, PREDOMINANTLY PMN FEW GRAM POSITIVE COCCI    Culture   Final    NO GROWTH < 12 HOURS Performed at [facility], [address] [place], [place] [address]    Report Status PENDING  Incomplete    Labs: CBC: Recent Labs  Lab 12/12/22 1404 12/12/22 2113 12/13/22 0625  WBC 14.6* 14.7* 11.6*  NEUTROABS 12.4* 13.8*  --   HGB 13.5 12.6 12.0  HCT 41.2 37.7 35.7*  MCV 89.0 88.3 88.4  PLT 290 269 263   Basic Metabolic Panel: Recent Labs   Lab 12/12/22 1404 12/12/22 2113 12/13/22 0625  NA 136 137 137  K 4.0 3.8 3.6  CL 101 104 103  CO2 27 25 25   GLUCOSE 104* 140* 140*  BUN 9 7 9   CREATININE 0.58 0.55 0.55  CALCIUM 9.1 8.6* 8.3*   Liver Function Tests: Recent Labs  Lab 12/12/22 1404 12/12/22 2113 12/13/22 0625  AST 15 18 11*  ALT 17 16 14   ALKPHOS 89 81 68  BILITOT 1.3* 1.0 1.0  PROT 8.1 7.6 6.9  ALBUMIN 4.0 3.5 3.2*     Discharge time spent: greater than 30 minutes.  Signed: Alford Highland, MD [facility] 12/13/2022

## 2022-12-13 NOTE — Progress Notes (Signed)
12/13/2022 8:32 AM  [name] [id number]  Post-Op Day 1    Temp:  [97.8 F (36.6 C)-98.7 F (37.1 C)] 98.3 F (36.8 C) (05/22 0307) Pulse Rate:  [58-112] 73 (05/22 0307) Resp:  [11-18] 16 (05/22 0307) BP: (110-150)/(41-72) 131/57 (05/22 0307) SpO2:  [92 %-100 %] 99 % (05/22 0307) Weight:  [86.2 kg-87.1 kg] 87.1 kg (05/22 0530),     Intake/Output Summary (Last 24 hours) at 12/13/2022 0832 Last data filed at 12/13/2022 0400 Gross per 24 hour  Intake 1143.15 ml  Output 505 ml  Net 638.15 ml    Results for orders placed or performed during the hospital encounter of 12/12/22 (from the past 24 hour(s))  Lactic acid, plasma     Status: None   Collection Time: 12/12/22  2:04 PM  Result Value Ref Range   Lactic Acid, Venous 0.8 0.5 - 1.9 mmol/L  Comprehensive metabolic panel     Status: Abnormal   Collection Time: 12/12/22  2:04 PM  Result Value Ref Range   Sodium 136 135 - 145 mmol/L   Potassium 4.0 3.5 - 5.1 mmol/L   Chloride 101 98 - 111 mmol/L   CO2 27 22 - 32 mmol/L   Glucose, Bld 104 (H) 70 - 99 mg/dL   BUN 9 6 - 20 mg/dL   Creatinine, Ser 7.82 0.44 - 1.00 mg/dL   Calcium 9.1 8.9 - 95.6 mg/dL   Total Protein 8.1 6.5 - 8.1 g/dL   Albumin 4.0 3.5 - 5.0 g/dL   AST 15 15 - 41 U/L   ALT 17 0 - 44 U/L   Alkaline Phosphatase 89 38 - 126 U/L   Total Bilirubin 1.3 (H) 0.3 - 1.2 mg/dL   GFR, Estimated >21 >30 mL/min   Anion gap 8 5 - 15  CBC with Differential     Status: Abnormal   Collection Time: 12/12/22  2:04 PM  Result Value Ref Range   WBC 14.6 (H) 4.0 - 10.5 K/uL   RBC 4.63 3.87 - 5.11 MIL/uL   Hemoglobin 13.5 12.0 - 15.0 g/dL   HCT 86.5 78.4 - 69.6 %   MCV 89.0 80.0 - 100.0 fL   MCH 29.2 26.0 - 34.0 pg   MCHC 32.8 30.0 - 36.0 g/dL   RDW 29.5 28.4 - 13.2 %   Platelets 290 150 - 400 K/uL   nRBC 0.0 0.0 - 0.2 %   Neutrophils Relative % 84 %   Neutro Abs 12.4 (H) 1.7 - 7.7 K/uL   Lymphocytes Relative 9 %   Lymphs Abs 1.3 0.7 - 4.0 K/uL   Monocytes Relative  6 %   Monocytes Absolute 0.8 0.1 - 1.0 K/uL   Eosinophils Relative 1 %   Eosinophils Absolute 0.1 0.0 - 0.5 K/uL   Basophils Relative 0 %   Basophils Absolute 0.1 0.0 - 0.1 K/uL   Immature Granulocytes 0 %   Abs Immature Granulocytes 0.05 0.00 - 0.07 K/uL  Group A Strep by PCR     Status: Abnormal   Collection Time: 12/12/22  4:28 PM   Specimen: Throat; Sterile Swab  Result Value Ref Range   Group A Strep by PCR DETECTED (A) NOT DETECTED  Aerobic/Anaerobic Culture w Gram Stain (surgical/deep wound)     Status: None (Preliminary result)   Collection Time: 12/12/22  6:35 PM   Specimen: Tonsil, Left; ENT  Result Value Ref Range   Specimen Description      ABSCESS Performed at [facility], [address] [place], [place] [address]    Special Requests      LEFT TONSAIL Performed at [facility], [address] [place], [place] [address]    Gram Stain      FEW WBC PRESENT, PREDOMINANTLY PMN FEW GRAM POSITIVE COCCI    Culture      NO GROWTH < 12 HOURS Performed at [facility], [address], [place], [place] [address]    Report Status PENDING   Lactic acid, plasma     Status: None   Collection Time: 12/12/22  9:13 PM  Result Value Ref Range   Lactic Acid, Venous 1.5 0.5 - 1.9 mmol/L  Comprehensive metabolic panel     Status: Abnormal   Collection Time: 12/12/22  9:13 PM  Result Value Ref Range   Sodium 137 135 - 145 mmol/L   Potassium 3.8 3.5 - 5.1 mmol/L   Chloride 104 98 - 111 mmol/L   CO2 25 22 - 32 mmol/L   Glucose, Bld 140 (H) 70 - 99 mg/dL   BUN 7 6 - 20 mg/dL   Creatinine, Ser 4.69 0.44 - 1.00 mg/dL   Calcium 8.6 (L) 8.9 - 10.3 mg/dL   Total Protein 7.6 6.5 - 8.1 g/dL   Albumin 3.5 3.5 - 5.0 g/dL   AST 18 15 - 41 U/L   ALT 16 0 - 44 U/L   Alkaline Phosphatase 81 38 - 126 U/L   Total Bilirubin 1.0 0.3 - 1.2 mg/dL   GFR, Estimated >62 >95 mL/min   Anion gap 8 5 - 15  CBC with Differential/Platelet     Status: Abnormal   Collection  Time: 12/12/22  9:13 PM  Result Value Ref Range   WBC 14.7 (H) 4.0 - 10.5 K/uL   RBC 4.27 3.87 - 5.11 MIL/uL   Hemoglobin 12.6 12.0 - 15.0 g/dL   HCT 28.4 13.2 - 44.0 %   MCV 88.3 80.0 - 100.0 fL   MCH 29.5 26.0 - 34.0 pg   MCHC 33.4 30.0 - 36.0 g/dL   RDW 10.2 72.5 - 36.6 %   Platelets 269 150 - 400 K/uL   nRBC 0.0 0.0 - 0.2 %   Neutrophils Relative % 93 %   Neutro Abs 13.8 (H) 1.7 - 7.7 K/uL   Lymphocytes Relative 5 %   Lymphs Abs 0.7 0.7 - 4.0 K/uL   Monocytes Relative 1 %   Monocytes Absolute 0.1 0.1 - 1.0 K/uL   Eosinophils Relative 0 %   Eosinophils Absolute 0.0 0.0 - 0.5 K/uL   Basophils Relative 0 %   Basophils Absolute 0.0 0.0 - 0.1 K/uL   Immature Granulocytes 1 %   Abs Immature Granulocytes 0.07 0.00 - 0.07 K/uL  Procalcitonin     Status: None   Collection Time: 12/12/22  9:13 PM  Result Value Ref Range   Procalcitonin <0.10 ng/mL  Lactic acid, plasma     Status: None   Collection Time: 12/12/22 11:10 PM  Result Value Ref Range   Lactic Acid, Venous 0.8 0.5 - 1.9 mmol/L  Lactic acid, plasma     Status: None   Collection Time: 12/13/22  1:41 AM  Result Value Ref Range   Lactic Acid, Venous 0.6 0.5 - 1.9 mmol/L  CBC     Status: Abnormal   Collection Time: 12/13/22  6:25 AM  Result Value Ref Range   WBC 11.6 (H) 4.0 - 10.5 K/uL   RBC 4.04 3.87 - 5.11 MIL/uL  Hemoglobin 12.0 12.0 - 15.0 g/dL   HCT 29.5 (L) 62.1 - 30.8 %   MCV 88.4 80.0 - 100.0 fL   MCH 29.7 26.0 - 34.0 pg   MCHC 33.6 30.0 - 36.0 g/dL   RDW 65.7 84.6 - 96.2 %   Platelets 263 150 - 400 K/uL   nRBC 0.0 0.0 - 0.2 %  Comprehensive metabolic panel     Status: Abnormal   Collection Time: 12/13/22  6:25 AM  Result Value Ref Range   Sodium 137 135 - 145 mmol/L   Potassium 3.6 3.5 - 5.1 mmol/L   Chloride 103 98 - 111 mmol/L   CO2 25 22 - 32 mmol/L   Glucose, Bld 140 (H) 70 - 99 mg/dL   BUN 9 6 - 20 mg/dL   Creatinine, Ser 9.52 0.44 - 1.00 mg/dL   Calcium 8.3 (L) 8.9 - 10.3 mg/dL   Total  Protein 6.9 6.5 - 8.1 g/dL   Albumin 3.2 (L) 3.5 - 5.0 g/dL   AST 11 (L) 15 - 41 U/L   ALT 14 0 - 44 U/L   Alkaline Phosphatase 68 38 - 126 U/L   Total Bilirubin 1.0 0.3 - 1.2 mg/dL   GFR, Estimated >84 >13 mL/min   Anion gap 9 5 - 15    SUBJECTIVE: Patient feeling much better this morning hungry eager to eat  OBJECTIVE: Oral cavity or pharynx uvular swelling has significantly decreased the peritonsillar swelling has also decreased.  No evidence of recurrent abscess.  IMPRESSION: Status post I&D of peritonsillar abscess  PLAN: Would recommend giving 1 more dose of Unasyn and Decadron prior to discharge.  Would recommend discharge to home on p.o. Augmentin 875 1 p.o. twice daily for 14 days and a 12-day double strength Sterapred taper.  I have instructed she and her husband to follow-up with me as an outpatient in 2 weeks.  They understand.  [name] 12/13/2022, 8:32 AM

## 2022-12-13 NOTE — Assessment & Plan Note (Signed)
Likely glucose elevated with steroids

## 2022-12-13 NOTE — Assessment & Plan Note (Signed)
Taken to the OR by Dr. Jenne Campus on 5/21.  Patient was on Unasyn and Decadron while here.  Cleared by ENT to go home on Augmentin for 2 weeks and Sterapred DS taper.

## 2022-12-13 NOTE — Assessment & Plan Note (Signed)
Patient has not started her cholesterol medication yet.

## 2022-12-13 NOTE — Progress Notes (Signed)
Patient discharged home with family.  Discharge instructions, when to follow up, and prescriptions reviewed with patient.  Patient verbalized understanding. Patient will be escorted out by auxiliary.   

## 2022-12-14 ENCOUNTER — Telehealth: Payer: Self-pay

## 2022-12-14 LAB — AEROBIC/ANAEROBIC CULTURE W GRAM STAIN (SURGICAL/DEEP WOUND)

## 2022-12-14 NOTE — Transitions of Care (Post Inpatient/ED Visit) (Signed)
   12/14/2022  Name: Jacqueline Pham MRN: [id number] DOB: 15-Jun-1966  Today's TOC FU Call Status: Today's TOC FU Call Status:: Unsuccessul Call (1st Attempt) Unsuccessful Call (1st Attempt) Date: 12/14/22  Attempted to reach the patient regarding the most recent Inpatient/ED visit.  Follow Up Plan: Additional outreach attempts will be made to reach the patient to complete the Transitions of Care (Post Inpatient/ED visit) call.   Signature Karena Addison, LPN [facility] Nurse Health Advisor Direct Dial [phone]

## 2022-12-15 LAB — AEROBIC/ANAEROBIC CULTURE W GRAM STAIN (SURGICAL/DEEP WOUND)

## 2022-12-17 LAB — AEROBIC/ANAEROBIC CULTURE W GRAM STAIN (SURGICAL/DEEP WOUND)

## 2022-12-19 NOTE — Anesthesia Postprocedure Evaluation (Signed)
Anesthesia Post Note  Patient: Jacqueline Pham  Procedure(s) Performed: INCISION AND DRAINAGE OF PERITONSILLAR ABCESS (Left: Mouth)  Patient location during evaluation: PACU Anesthesia Type: General Level of consciousness: awake and alert Pain management: pain level controlled Vital Signs Assessment: post-procedure vital signs reviewed and stable Respiratory status: spontaneous breathing, nonlabored ventilation, respiratory function stable and patient connected to nasal cannula oxygen Cardiovascular status: blood pressure returned to baseline and stable Postop Assessment: no apparent nausea or vomiting Anesthetic complications: no   No notable events documented.   Last Vitals:  Vitals:   12/13/22 0307 12/13/22 0835  BP: (!) 131/57 119/61  Pulse: 73 60  Resp: 16 20  Temp: 36.8 C 36.6 C  SpO2: 99% 95%    Last Pain:  Vitals:   12/13/22 0845  TempSrc:   PainSc: 1                  Yevette Edwards

## 2022-12-19 NOTE — Transitions of Care (Post Inpatient/ED Visit) (Unsigned)
   12/19/2022  Name: Jacqueline Pham MRN: [id number] DOB: 01-Feb-1966  Today's TOC FU Call Status: Today's TOC FU Call Status:: Unsuccessful Call (2nd Attempt) Unsuccessful Call (1st Attempt) Date: 12/14/22 Unsuccessful Call (2nd Attempt) Date: 12/19/22  Attempted to reach the patient regarding the most recent Inpatient/ED visit.  Follow Up Plan: Additional outreach attempts will be made to reach the patient to complete the Transitions of Care (Post Inpatient/ED visit) call.   Signature Karena Addison, LPN [facility] Nurse Health Advisor Direct Dial [phone]

## 2022-12-20 NOTE — Transitions of Care (Post Inpatient/ED Visit) (Signed)
   12/20/2022  Name: Genaya Vendetti MRN: [id number] DOB: 29-Apr-1966  Today's TOC FU Call Status: Today's TOC FU Call Status:: Unsuccessful Call (3rd Attempt) Unsuccessful Call (1st Attempt) Date: 12/14/22 Unsuccessful Call (2nd Attempt) Date: 12/19/22 Unsuccessful Call (3rd Attempt) Date: 12/20/22  Attempted to reach the patient regarding the most recent Inpatient/ED visit.  Follow Up Plan: No further outreach attempts will be made at this time. We have been unable to contact the patient.  Signature Karena Addison, LPN [facility] Nurse Health Advisor Direct Dial [phone]

## 2023-02-15 LAB — HM MAMMOGRAPHY

## 2023-03-09 ENCOUNTER — Ambulatory Visit (INDEPENDENT_AMBULATORY_CARE_PROVIDER_SITE_OTHER): Payer: No Typology Code available for payment source | Admitting: Family Medicine

## 2023-03-09 ENCOUNTER — Encounter: Payer: Self-pay | Admitting: Family Medicine

## 2023-03-09 ENCOUNTER — Ambulatory Visit: Payer: No Typology Code available for payment source | Admitting: Family Medicine

## 2023-03-09 VITALS — BP 112/64 | HR 79 | Ht 67.0 in | Wt 190.0 lb

## 2023-03-09 DIAGNOSIS — Z0000 Encounter for general adult medical examination without abnormal findings: Secondary | ICD-10-CM | POA: Diagnosis not present

## 2023-03-09 DIAGNOSIS — E782 Mixed hyperlipidemia: Secondary | ICD-10-CM | POA: Diagnosis not present

## 2023-03-09 DIAGNOSIS — J4520 Mild intermittent asthma, uncomplicated: Secondary | ICD-10-CM

## 2023-03-09 MED ORDER — ATORVASTATIN CALCIUM 20 MG PO TABS: 20.0000 mg | ORAL_TABLET | Freq: Every day | ORAL | 1 refills | Status: DC

## 2023-03-09 NOTE — Progress Notes (Signed)
Date:  03/09/2023   Name:  Jacqueline Pham   DOB:  08-19-1965   MRN:  [id number]   Chief Complaint: Annual Exam, Asthma, and Hyperlipidemia  Patient is a 57year old female who presents for a comprehensive physical exam. The patient reports the following problems: History of asthma and hyperlipidemia.Marland Kitchen Health maintenance has been reviewed  history of asthma and hyperlipidemia.  Up-to-date    Asthma There is no hemoptysis or shortness of breath. The problem has been gradually improving. Pertinent negatives include no chest pain, dyspnea on exertion, ear pain, fever, myalgias, nasal congestion or sore throat. Her symptoms are aggravated by nothing. Relieved by: currently not taking b agonist. Her past medical history is significant for asthma.  Hyperlipidemia This is a chronic problem. The current episode started more than 1 year ago. The problem is controlled. Recent lipid tests were reviewed and are normal. She has no history of chronic renal disease, diabetes, hypothyroidism, liver disease, obesity or nephrotic syndrome. There are no known factors aggravating her hyperlipidemia. Pertinent negatives include no chest pain, focal sensory loss, focal weakness, leg pain, myalgias or shortness of breath. Current antihyperlipidemic treatment includes statins. The current treatment provides moderate improvement of lipids. There are no compliance problems.     Lab Results  Component Value Date   NA 137 12/13/2022   K 3.6 12/13/2022   CO2 25 12/13/2022   GLUCOSE 140 (H) 12/13/2022   BUN 9 12/13/2022   CREATININE 0.55 12/13/2022   CALCIUM 8.3 (L) 12/13/2022   EGFR 104 09/08/2022   GFRNONAA >60 12/13/2022   Lab Results  Component Value Date   CHOL 274 (H) 09/08/2022   HDL 63 09/08/2022   LDLCALC 184 (H) 09/08/2022   TRIG 150 (H) 09/08/2022   CHOLHDL 3.9 07/31/2018   No results found for: "TSH" Lab Results  Component Value Date   HGBA1C 5.4 01/11/2022   Lab Results  Component Value  Date   WBC 11.6 (H) 12/13/2022   HGB 12.0 12/13/2022   HCT 35.7 (L) 12/13/2022   MCV 88.4 12/13/2022   PLT 263 12/13/2022   Lab Results  Component Value Date   ALT 14 12/13/2022   AST 11 (L) 12/13/2022   ALKPHOS 68 12/13/2022   BILITOT 1.0 12/13/2022   No results found for: "25OHVITD2", "25OHVITD3", "VD25OH"   Review of Systems  Constitutional:  Negative for fatigue, fever and unexpected weight change.  HENT:  Negative for ear pain and sore throat.   Eyes:  Negative for visual disturbance.  Respiratory:  Negative for hemoptysis and shortness of breath.   Cardiovascular:  Negative for chest pain, dyspnea on exertion, palpitations and leg swelling.  Gastrointestinal:  Negative for abdominal pain and blood in stool.  Endocrine: Negative for polydipsia.  Genitourinary:  Negative for hematuria.  Musculoskeletal:  Negative for myalgias.  Neurological:  Negative for focal weakness.    Patient Active Problem List   Diagnosis Date Noted   Impaired fasting glucose 12/13/2022   Obesity (BMI 30-39.9) 12/13/2022   Peritonsillar abscess 12/12/2022   Hyperlipidemia 02/23/2021   Reactive airway disease 02/23/2021   Personal history of colonic polyps    Encounter for screening colonoscopy    Benign neoplasm of cecum    History of migraine 07/23/2014   Menorrhagia 06/02/2014    No Known Allergies  Past Surgical History:  Procedure Laterality Date   ABLATION  2015   uterine   CATARACT EXTRACTION W/ INTRAOCULAR LENS IMPLANT Right 07/08/2019   [facility]  COLONOSCOPY WITH PROPOFOL N/A 08/30/2018   Procedure: COLONOSCOPY WITH PROPOFOL;  Surgeon: Midge Minium, MD;  Location: [facility];  Service: Endoscopy;  Laterality: N/A;   COLONOSCOPY WITH PROPOFOL N/A 09/08/2019   Procedure: COLONOSCOPY WITH PROPOFOL;  Surgeon: Midge Minium, MD;  Location: [facility];  Service: Endoscopy;  Laterality: N/A;   DILATION AND CURETTAGE OF UTERUS  2003   INCISION AND DRAINAGE OF  PERITONSILLAR ABCESS Left 12/12/2022   Procedure: INCISION AND DRAINAGE OF PERITONSILLAR ABCESS;  Surgeon: Linus Salmons, MD;  Location: [facility];  Service: ENT;  Laterality: Left;   POLYPECTOMY  08/30/2018   Procedure: POLYPECTOMY;  Surgeon: Midge Minium, MD;  Location: [facility];  Service: Endoscopy;;   POLYPECTOMY N/A 09/08/2019   Procedure: POLYPECTOMY;  Surgeon: Midge Minium, MD;  Location: [facility];  Service: Endoscopy;  Laterality: N/A;    Social History   Tobacco Use   Smoking status: Former    Current packs/day: 0.00    Types: Cigarettes    Quit date: 1997    Years since quitting: 27.6   Smokeless tobacco: Never  Vaping Use   Vaping status: Never Used  Substance Use Topics   Alcohol use: Yes    Alcohol/week: 0.0 standard drinks of alcohol    Comment: occasionally - may have 2-4 drinks/month   Drug use: No     Medication list has been reviewed and updated.  Current Meds  Medication Sig   albuterol (VENTOLIN HFA) 108 (90 Base) MCG/ACT inhaler Inhale 1-2 puffs into the lungs every 6 (six) hours as needed for wheezing or shortness of breath.   atorvastatin (LIPITOR) 20 MG tablet Take 1 tablet (20 mg total) by mouth daily.   Cyanocobalamin (VITAMIN B-12 PO) Take by mouth daily.   Melatonin 5 MG TABS Take 5 mg by mouth at bedtime as needed.   Multiple Vitamin (MULTIVITAMIN) tablet Take 1 tablet by mouth daily.   Omega-3 Fatty Acids (FISH OIL PO) Take by mouth daily.   [DISCONTINUED] predniSONE (STERAPRED UNI-PAK 48 TAB) 10 MG (48) TBPK tablet Take 6 tabs po daily for 2 days, take 5 tabs po daily for two days, continue to decrease one tablete every two days until complete.       03/09/2023    9:55 AM 09/08/2022    8:03 AM 08/08/2022    4:31 PM 01/11/2022    7:56 AM  GAD 7 : Generalized Anxiety Score  Nervous, Anxious, on Edge 0 0 0 0  Control/stop worrying 0 0 0 0  Worry too much - different things 0 0 0 0  Trouble relaxing 0 0 0 0  Restless 0 0 0  0  Easily annoyed or irritable 0 0 0 0  Afraid - awful might happen 0 0 0 0  Total GAD 7 Score 0 0 0 0  Anxiety Difficulty Not difficult at all Not difficult at all Not difficult at all Not difficult at all       03/09/2023    9:55 AM 09/08/2022    8:03 AM 08/08/2022    4:31 PM  Depression screen PHQ 2/9  Decreased Interest 0 0 0  Down, Depressed, Hopeless 0 0 0  PHQ - 2 Score 0 0 0  Altered sleeping 0 0 0  Tired, decreased energy 0 0 0  Change in appetite 0 0 0  Feeling bad or failure about yourself  0 0 0  Trouble concentrating 0 0 0  Moving slowly or fidgety/restless 0 0  0  Suicidal thoughts 0 0 0  PHQ-9 Score 0 0 0  Difficult doing work/chores Not difficult at all Not difficult at all Not difficult at all    BP Readings from Last 3 Encounters:  03/09/23 112/64  12/13/22 119/61  09/08/22 120/76    Physical Exam Vitals and nursing note reviewed. Exam conducted with a chaperone present.  Constitutional:      General: She is not in acute distress.    Appearance: She is not diaphoretic.  HENT:     Head: Normocephalic and atraumatic.     Right Ear: External ear normal.     Left Ear: External ear normal.     Nose: Nose normal.  Eyes:     General:        Right eye: No discharge.        Left eye: No discharge.     Conjunctiva/sclera: Conjunctivae normal.     Pupils: Pupils are equal, round, and reactive to light.  Neck:     Thyroid: No thyromegaly.     Vascular: No JVD.  Cardiovascular:     Rate and Rhythm: Normal rate and regular rhythm.     Heart sounds: Normal heart sounds, S1 normal and S2 normal. No murmur heard.    No systolic murmur is present.     No diastolic murmur is present.     No friction rub. No gallop.  Pulmonary:     Effort: Pulmonary effort is normal.     Breath sounds: Normal breath sounds. No wheezing, rhonchi or rales.  Abdominal:     General: Bowel sounds are normal.     Palpations: Abdomen is soft. There is no hepatomegaly, splenomegaly or  mass.     Tenderness: There is no abdominal tenderness. There is no guarding.  Musculoskeletal:        General: Normal range of motion.     Cervical back: Normal range of motion and neck supple.     Right lower leg: No edema.     Left lower leg: No edema.  Lymphadenopathy:     Cervical: No cervical adenopathy.  Skin:    General: Skin is warm and dry.  Neurological:     Mental Status: She is alert.     Deep Tendon Reflexes: Reflexes are normal and symmetric.     Wt Readings from Last 3 Encounters:  03/09/23 190 lb (86.2 kg)  12/13/22 192 lb (87.1 kg)  09/08/22 193 lb (87.5 kg)    BP 112/64   Pulse 79   Ht 5\' 7"  (1.702 m)   Wt 190 lb (86.2 kg)   SpO2 96%   BMI 29.76 kg/m   Assessment and Plan: 1. Annual physical exam No subjective/objective concerns noted during HPI, review of past medical history and meds, review of systems and physical exam.  2. Mild intermittent reactive airway disease without complication Chronic.  Controlled.  Stable.  Currently not having to use inhalers for asthma control and will continue to monitor on an as-needed basis.  3. Moderate mixed hyperlipidemia not requiring statin therapy Chronic.  Diet and medication controlled.  Tolerating medication well will out side effects.  Will continue atorvastatin 20 mg once a day. - atorvastatin (LIPITOR) 20 MG tablet; Take 1 tablet (20 mg total) by mouth daily.  Dispense: 90 tablet; Refill: 1  Review of previous lipid panel is acceptable.   Elizabeth Sauer, MD

## 2023-06-08 ENCOUNTER — Encounter: Payer: Self-pay | Admitting: Family Medicine

## 2023-08-25 ENCOUNTER — Other Ambulatory Visit: Payer: Self-pay | Admitting: Family Medicine

## 2023-08-25 DIAGNOSIS — J4520 Mild intermittent asthma, uncomplicated: Secondary | ICD-10-CM

## 2023-08-30 ENCOUNTER — Other Ambulatory Visit: Payer: Self-pay | Admitting: Family Medicine

## 2023-08-30 ENCOUNTER — Ambulatory Visit: Payer: No Typology Code available for payment source | Admitting: Family Medicine

## 2023-08-30 ENCOUNTER — Encounter: Payer: Self-pay | Admitting: Family Medicine

## 2023-08-30 VITALS — BP 110/80 | HR 79 | Ht 67.0 in | Wt 192.0 lb

## 2023-08-30 DIAGNOSIS — E782 Mixed hyperlipidemia: Secondary | ICD-10-CM | POA: Diagnosis not present

## 2023-08-30 DIAGNOSIS — J0110 Acute frontal sinusitis, unspecified: Secondary | ICD-10-CM

## 2023-08-30 DIAGNOSIS — J4520 Mild intermittent asthma, uncomplicated: Secondary | ICD-10-CM | POA: Diagnosis not present

## 2023-08-30 DIAGNOSIS — R6889 Other general symptoms and signs: Secondary | ICD-10-CM | POA: Diagnosis not present

## 2023-08-30 LAB — POCT INFLUENZA A/B
Influenza A, POC: NEGATIVE
Influenza B, POC: NEGATIVE

## 2023-08-30 LAB — POC COVID19 BINAXNOW: SARS Coronavirus 2 Ag: NEGATIVE

## 2023-08-30 LAB — POCT RAPID STREP A (OFFICE): Rapid Strep A Screen: NEGATIVE

## 2023-08-30 MED ORDER — ALBUTEROL SULFATE HFA 108 (90 BASE) MCG/ACT IN AERS
2.0000 | INHALATION_SPRAY | Freq: Four times a day (QID) | RESPIRATORY_TRACT | 6 refills | Status: DC | PRN
Start: 2023-08-30 — End: 2023-08-31

## 2023-08-30 MED ORDER — AMOXICILLIN 500 MG PO CAPS
500.0000 mg | ORAL_CAPSULE | Freq: Three times a day (TID) | ORAL | 0 refills | Status: AC
Start: 2023-08-30 — End: 2023-09-09

## 2023-08-30 MED ORDER — ATORVASTATIN CALCIUM 20 MG PO TABS: 20.0000 mg | ORAL_TABLET | Freq: Every day | ORAL | 1 refills | Status: DC

## 2023-08-30 NOTE — Addendum Note (Signed)
 Addended by: Rama Burkitt on: 08/30/2023 10:32 AM   Modules accepted: Orders

## 2023-08-30 NOTE — Patient Instructions (Signed)

## 2023-08-30 NOTE — Progress Notes (Signed)
 Date:  08/30/2023   Name:  Jacqueline Pham   DOB:  1965/12/16   MRN:  [id number]   Chief Complaint: Asthma, Hyperlipidemia, and Sore Throat (X 3 days, tylenol , sudafed, tylenol  cold and flu not helping, low grade fever, headache, teacher )  Asthma There is no chest tightness, cough, difficulty breathing, frequent throat clearing, hemoptysis, hoarse voice, shortness of breath, sputum production or wheezing. This is a recurrent problem. The current episode started more than 1 year ago. The problem occurs intermittently. The problem has been waxing and waning. Associated symptoms include headaches, myalgias, postnasal drip and a sore throat. Pertinent negatives include no chest pain, dyspnea on exertion, ear congestion, ear pain, fever, heartburn, malaise/fatigue, nasal congestion, orthopnea, PND, rhinorrhea, sneezing, sweats, trouble swallowing or weight loss. Associated symptoms comments: frontal. Her symptoms are aggravated by nothing. Her symptoms are alleviated by nothing. She reports minimal improvement on treatment. Her past medical history is significant for asthma.  Hyperlipidemia This is a chronic problem. The current episode started more than 1 year ago. The problem is controlled. Recent lipid tests were reviewed and are normal. She has no history of chronic renal disease, diabetes, hypothyroidism, liver disease, obesity or nephrotic syndrome. Associated symptoms include myalgias. Pertinent negatives include no chest pain, focal sensory loss, focal weakness, leg pain or shortness of breath. Current antihyperlipidemic treatment includes statins and diet change. The current treatment provides moderate improvement of lipids. There are no compliance problems.  Risk factors for coronary artery disease include dyslipidemia.  Sore Throat  This is a new problem. The current episode started in the past 7 days. The problem has been waxing and waning. The pain is worse on the left side. Maximum  temperature: 99. The pain is mild. Associated symptoms include congestion and headaches. Pertinent negatives include no abdominal pain, coughing, diarrhea, ear discharge, ear pain, hoarse voice, plugged ear sensation, shortness of breath or trouble swallowing. She has had no exposure to strep or mono. She has tried nothing for the symptoms. The treatment provided mild relief.    Lab Results  Component Value Date   NA 137 12/13/2022   K 3.6 12/13/2022   CO2 25 12/13/2022   GLUCOSE 140 (H) 12/13/2022   BUN 9 12/13/2022   CREATININE 0.55 12/13/2022   CALCIUM  8.3 (L) 12/13/2022   EGFR 104 09/08/2022   GFRNONAA >60 12/13/2022   Lab Results  Component Value Date   CHOL 274 (H) 09/08/2022   HDL 63 09/08/2022   LDLCALC 184 (H) 09/08/2022   TRIG 150 (H) 09/08/2022   CHOLHDL 3.9 07/31/2018   No results found for: TSH Lab Results  Component Value Date   HGBA1C 5.4 01/11/2022   Lab Results  Component Value Date   WBC 11.6 (H) 12/13/2022   HGB 12.0 12/13/2022   HCT 35.7 (L) 12/13/2022   MCV 88.4 12/13/2022   PLT 263 12/13/2022   Lab Results  Component Value Date   ALT 14 12/13/2022   AST 11 (L) 12/13/2022   ALKPHOS 68 12/13/2022   BILITOT 1.0 12/13/2022   No results found for: 25OHVITD2, 25OHVITD3, VD25OH   Review of Systems  Constitutional:  Negative for chills, fatigue, fever, malaise/fatigue and weight loss.  HENT:  Positive for congestion, postnasal drip, sinus pressure and sore throat. Negative for ear discharge, ear pain, hoarse voice, rhinorrhea, sneezing and trouble swallowing.   Eyes:  Negative for visual disturbance.  Respiratory:  Negative for cough, hemoptysis, sputum production, chest tightness, shortness of breath and  wheezing.   Cardiovascular:  Negative for chest pain, dyspnea on exertion, palpitations and PND.  Gastrointestinal:  Negative for abdominal pain, blood in stool, diarrhea and heartburn.  Genitourinary:  Negative for difficulty urinating,  dysuria, flank pain, frequency, genital sores and hematuria.  Musculoskeletal:  Positive for myalgias.  Neurological:  Positive for headaches. Negative for focal weakness.    Patient Active Problem List   Diagnosis Date Noted   Impaired fasting glucose 12/13/2022   Obesity (BMI 30-39.9) 12/13/2022   Peritonsillar abscess 12/12/2022   Hyperlipidemia 02/23/2021   Reactive airway disease 02/23/2021   History of colonic polyps    Encounter for screening colonoscopy    Benign neoplasm of cecum    History of migraine 07/23/2014   Menorrhagia 06/02/2014    No Known Allergies  Past Surgical History:  Procedure Laterality Date   ABLATION  2015   uterine   CATARACT EXTRACTION W/ INTRAOCULAR LENS IMPLANT Right 07/08/2019   [facility]   COLONOSCOPY WITH PROPOFOL  N/A 08/30/2018   Procedure: COLONOSCOPY WITH PROPOFOL ;  Surgeon: Jinny Carmine, MD;  Location: [facility];  Service: Endoscopy;  Laterality: N/A;   COLONOSCOPY WITH PROPOFOL  N/A 09/08/2019   Procedure: COLONOSCOPY WITH PROPOFOL ;  Surgeon: Jinny Carmine, MD;  Location: [facility];  Service: Endoscopy;  Laterality: N/A;   DILATION AND CURETTAGE OF UTERUS  2003   INCISION AND DRAINAGE OF PERITONSILLAR ABCESS Left 12/12/2022   Procedure: INCISION AND DRAINAGE OF PERITONSILLAR ABCESS;  Surgeon: Herminio Miu, MD;  Location: [facility];  Service: ENT;  Laterality: Left;   POLYPECTOMY  08/30/2018   Procedure: POLYPECTOMY;  Surgeon: Jinny Carmine, MD;  Location: [facility];  Service: Endoscopy;;   POLYPECTOMY N/A 09/08/2019   Procedure: POLYPECTOMY;  Surgeon: Jinny Carmine, MD;  Location: [facility];  Service: Endoscopy;  Laterality: N/A;    Social History   Tobacco Use   Smoking status: Former    Current packs/day: 0.00    Types: Cigarettes    Quit date: 1997    Years since quitting: 28.1   Smokeless tobacco: Never  Vaping Use   Vaping status: Never Used  Substance Use Topics   Alcohol use: Yes     Alcohol/week: 0.0 standard drinks of alcohol    Comment: occasionally - may have 2-4 drinks/month   Drug use: No     Medication list has been reviewed and updated.  Current Meds  Medication Sig   albuterol  (VENTOLIN  HFA) 108 (90 Base) MCG/ACT inhaler INHALE 1-2 PUFFS BY MOUTH EVERY 6 HOURS AS NEEDED FOR WHEEZE OR SHORTNESS OF BREATH   atorvastatin  (LIPITOR) 20 MG tablet Take 1 tablet (20 mg total) by mouth daily.   Cyanocobalamin (VITAMIN B-12 PO) Take by mouth daily.   Melatonin 5 MG TABS Take 5 mg by mouth at bedtime as needed.   Multiple Vitamin (MULTIVITAMIN) tablet Take 1 tablet by mouth daily.   Omega-3 Fatty Acids (FISH OIL PO) Take by mouth daily.       03/09/2023    9:55 AM 09/08/2022    8:03 AM 08/08/2022    4:31 PM 01/11/2022    7:56 AM  GAD 7 : Generalized Anxiety Score  Nervous, Anxious, on Edge 0 0 0 0  Control/stop worrying 0 0 0 0  Worry too much - different things 0 0 0 0  Trouble relaxing 0 0 0 0  Restless 0 0 0 0  Easily annoyed or irritable 0 0 0 0  Afraid - awful might happen 0  0 0 0  Total GAD 7 Score 0 0 0 0  Anxiety Difficulty Not difficult at all Not difficult at all Not difficult at all Not difficult at all       03/09/2023    9:55 AM 09/08/2022    8:03 AM 08/08/2022    4:31 PM  Depression screen PHQ 2/9  Decreased Interest 0 0 0  Down, Depressed, Hopeless 0 0 0  PHQ - 2 Score 0 0 0  Altered sleeping 0 0 0  Tired, decreased energy 0 0 0  Change in appetite 0 0 0  Feeling bad or failure about yourself  0 0 0  Trouble concentrating 0 0 0  Moving slowly or fidgety/restless 0 0 0  Suicidal thoughts 0 0 0  PHQ-9 Score 0 0 0  Difficult doing work/chores Not difficult at all Not difficult at all Not difficult at all    BP Readings from Last 3 Encounters:  08/30/23 110/80  03/09/23 112/64  12/13/22 119/61    Physical Exam Vitals and nursing note reviewed.  Constitutional:      General: She is not in acute distress.    Appearance: She is not  diaphoretic.  HENT:     Head: Normocephalic and atraumatic.     Right Ear: Tympanic membrane and external ear normal. No middle ear effusion.     Left Ear: Tympanic membrane and external ear normal.  No middle ear effusion.     Nose: Congestion present. No rhinorrhea.     Mouth/Throat:     Mouth: Mucous membranes are moist.     Pharynx: Posterior oropharyngeal erythema present. No oropharyngeal exudate.  Eyes:     General:        Right eye: No discharge.        Left eye: No discharge.     Conjunctiva/sclera: Conjunctivae normal.     Pupils: Pupils are equal, round, and reactive to light.  Neck:     Thyroid: No thyromegaly.     Vascular: No carotid bruit or JVD.     Trachea: Trachea normal.  Cardiovascular:     Rate and Rhythm: Normal rate and regular rhythm.     Heart sounds: Normal heart sounds. No murmur heard.    No friction rub. No gallop.  Pulmonary:     Effort: Pulmonary effort is normal.     Breath sounds: Normal breath sounds.  Abdominal:     General: Bowel sounds are normal.     Palpations: Abdomen is soft. There is no mass.     Tenderness: There is no abdominal tenderness. There is no guarding.  Musculoskeletal:        General: Normal range of motion.     Cervical back: Neck supple.  Lymphadenopathy:     Cervical: No cervical adenopathy.     Right cervical: No deep or posterior cervical adenopathy.    Left cervical: No superficial, deep or posterior cervical adenopathy.  Skin:    General: Skin is warm and dry.  Neurological:     Mental Status: She is alert.     Wt Readings from Last 3 Encounters:  08/30/23 192 lb (87.1 kg)  03/09/23 190 lb (86.2 kg)  12/13/22 192 lb (87.1 kg)    BP 110/80   Pulse 79   Ht 5' 7 (1.702 m)   Wt 192 lb (87.1 kg)   SpO2 96%   BMI 30.07 kg/m   Assessment and Plan: 1. Flu-like symptoms (Primary) New onset.  Persistent.  Stable.  Initiate evaluation with COVID and influenza screening.  Test results are negative for acute  infection.  Will further evaluate. - POC COVID-19 BinaxNow - POCT Influenza A/B  2. Mild intermittent reactive airway disease without complication Chronic.  Controlled.  Stable.  Patient has excellent control with as needed albuterol  1 to 2 puffs every 6 hours as needed for wheezing.  Patient was will bit a cough I suggested that she would go ahead and resume her albuterol  but this is has a presentation of reactivity of airways. - albuterol  (VENTOLIN  HFA) 108 (90 Base) MCG/ACT inhaler; Inhale 2 puffs into the lungs every 6 (six) hours as needed for wheezing or shortness of breath.  Dispense: 8.5 each; Refill: 6  3. Moderate mixed hyperlipidemia not requiring statin therapy Chronic.  Controlled.  Stable.  Asymptomatic problems with myalgias or muscle weakness.  Will check CMP for hepatic concerns and lipid panel to determine current LDL control.  - atorvastatin  (LIPITOR) 20 MG tablet; Take 1 tablet (20 mg total) by mouth daily.  Dispense: 90 tablet; Refill: 1 - Comprehensive metabolic panel - Lipid Panel With LDL/HDL Ratio  4. Acute non-recurrent frontal sinusitis New onset persistent tenderness over the frontal sinuses bilateral.  Patient does have yellow nasal discharge.  History and examination consistent with an acute sinus infection we will treat with amoxicillin  500 mg 3 times a day for 10 days.    Cathryne Molt, MD

## 2023-08-31 LAB — COMPREHENSIVE METABOLIC PANEL
ALT: 15 [IU]/L (ref 0–32)
AST: 15 [IU]/L (ref 0–40)
Albumin: 4.2 g/dL (ref 3.8–4.9)
Alkaline Phosphatase: 120 [IU]/L (ref 44–121)
BUN/Creatinine Ratio: 16 (ref 9–23)
BUN: 11 mg/dL (ref 6–24)
Bilirubin Total: 0.6 mg/dL (ref 0.0–1.2)
CO2: 24 mmol/L (ref 20–29)
Calcium: 9.8 mg/dL (ref 8.7–10.2)
Chloride: 104 mmol/L (ref 96–106)
Creatinine, Ser: 0.67 mg/dL (ref 0.57–1.00)
Globulin, Total: 3 g/dL (ref 1.5–4.5)
Glucose: 90 mg/dL (ref 70–99)
Potassium: 4.5 mmol/L (ref 3.5–5.2)
Sodium: 142 mmol/L (ref 134–144)
Total Protein: 7.2 g/dL (ref 6.0–8.5)
eGFR: 103 mL/min/{1.73_m2} (ref >59)

## 2023-08-31 LAB — LIPID PANEL WITH LDL/HDL RATIO
Cholesterol, Total: 252 mg/dL — ABNORMAL HIGH (ref 100–199)
HDL: 62 mg/dL (ref >39)
LDL Chol Calc (NIH): 167 mg/dL — ABNORMAL HIGH (ref 0–99)
LDL/HDL Ratio: 2.7 {ratio} (ref 0.0–3.2)
Triglycerides: 128 mg/dL (ref 0–149)
VLDL Cholesterol Cal: 23 mg/dL (ref 5–40)

## 2023-09-04 ENCOUNTER — Telehealth: Payer: Self-pay

## 2023-09-04 NOTE — Telephone Encounter (Signed)
Labs printed and mailed.  KP

## 2023-09-04 NOTE — Progress Notes (Signed)
Called pt could not leave VM.  KP

## 2023-09-18 ENCOUNTER — Encounter: Payer: Self-pay | Admitting: Family Medicine

## 2023-09-18 ENCOUNTER — Ambulatory Visit: Payer: No Typology Code available for payment source | Admitting: Family Medicine

## 2023-09-18 ENCOUNTER — Other Ambulatory Visit
Admission: RE | Admit: 2023-09-18 | Discharge: 2023-09-18 | Disposition: A | Discharge status: Home / Self Care | Payer: No Typology Code available for payment source | Attending: Family Medicine | Admitting: Family Medicine

## 2023-09-18 VITALS — BP 138/78 | HR 91 | Resp 16 | Ht 67.0 in | Wt 192.0 lb

## 2023-09-18 DIAGNOSIS — B09 Unspecified viral infection characterized by skin and mucous membrane lesions: Secondary | ICD-10-CM | POA: Diagnosis not present

## 2023-09-18 DIAGNOSIS — J029 Acute pharyngitis, unspecified: Secondary | ICD-10-CM

## 2023-09-18 DIAGNOSIS — R310 Gross hematuria: Secondary | ICD-10-CM | POA: Diagnosis not present

## 2023-09-18 LAB — CBC WITH DIFFERENTIAL/PLATELET
Abs Immature Granulocytes: 0.04 10*3/uL (ref 0.00–0.07)
Basophils Absolute: 0.1 10*3/uL (ref 0.0–0.1)
Basophils Relative: 1 %
Eosinophils Absolute: 0.2 10*3/uL (ref 0.0–0.5)
Eosinophils Relative: 4 %
HCT: 36.9 % (ref 36.0–46.0)
Hemoglobin: 12.4 g/dL (ref 12.0–15.0)
Immature Granulocytes: 1 %
Lymphocytes Relative: 19 %
Lymphs Abs: 1.2 10*3/uL (ref 0.7–4.0)
MCH: 29 pg (ref 26.0–34.0)
MCHC: 33.6 g/dL (ref 30.0–36.0)
MCV: 86.4 fL (ref 80.0–100.0)
Monocytes Absolute: 0.3 10*3/uL (ref 0.1–1.0)
Monocytes Relative: 5 %
Neutro Abs: 4.5 10*3/uL (ref 1.7–7.7)
Neutrophils Relative %: 70 %
Platelets: 293 10*3/uL (ref 150–400)
RBC: 4.27 MIL/uL (ref 3.87–5.11)
RDW: 12.5 % (ref 11.5–15.5)
Smear Review: NORMAL
WBC Morphology: ABNORMAL
WBC: 6.3 10*3/uL (ref 4.0–10.5)
nRBC: 0 % (ref 0.0–0.2)

## 2023-09-18 LAB — RENAL FUNCTION PANEL
Albumin: 3.8 g/dL (ref 3.5–5.0)
Anion gap: 12 (ref 5–15)
BUN: 11 mg/dL (ref 6–20)
CO2: 25 mmol/L (ref 22–32)
Calcium: 8.9 mg/dL (ref 8.9–10.3)
Chloride: 99 mmol/L (ref 98–111)
Creatinine, Ser: 0.61 mg/dL (ref 0.44–1.00)
GFR, Estimated: >60 mL/min (ref >60)
Glucose, Bld: 95 mg/dL (ref 70–99)
Phosphorus: 3.4 mg/dL (ref 2.5–4.6)
Potassium: 3.9 mmol/L (ref 3.5–5.1)
Sodium: 136 mmol/L (ref 135–145)

## 2023-09-18 LAB — POCT URINALYSIS DIPSTICK
Bilirubin, UA: NEGATIVE
Glucose, UA: NEGATIVE
Ketones, UA: NEGATIVE
Leukocytes, UA: NEGATIVE
Nitrite, UA: NEGATIVE
Protein, UA: NEGATIVE
Spec Grav, UA: >=1.03 — AB (ref 1.010–1.025)
Urobilinogen, UA: 0.2 U/dL
pH, UA: 5 (ref 5.0–8.0)

## 2023-09-18 LAB — POCT RAPID STREP A (OFFICE): Rapid Strep A Screen: NEGATIVE

## 2023-09-18 NOTE — Progress Notes (Signed)
 Date:  09/18/2023   Name:  Jacqueline Pham   DOB:  [birth year]   MRN:  [id number]   Chief Complaint: Rash (Noticed legs are red since yesterday has been exposed to fifths disease. ) and Foot Swelling (Swelling in ankles x 1 day. )  Rash This is a new problem. The current episode started yesterday. The problem has been gradually worsening since onset. The affected locations include the left arm, right arm, right upper leg, left upper leg, left lower leg, right lower leg, right ankle and left ankle. The rash is characterized by redness, swelling and itchiness. She was exposed to a new medication and an ill contact (restarted lipitor/exposed fifth disease). Pertinent negatives include no congestion, cough, diarrhea, fatigue, fever, rhinorrhea, shortness of breath or sore throat.    Lab Results  Component Value Date   NA 142 08/30/2023   K 4.5 08/30/2023   CO2 24 08/30/2023   GLUCOSE 90 08/30/2023   BUN 11 08/30/2023   CREATININE 0.67 08/30/2023   CALCIUM 9.8 08/30/2023   EGFR 103 08/30/2023   GFRNONAA >60 12/13/2022   Lab Results  Component Value Date   CHOL 252 (H) 08/30/2023   HDL 62 08/30/2023   LDLCALC 167 (H) 08/30/2023   TRIG 128 08/30/2023   CHOLHDL 3.9 07/31/2018   No results found for: "TSH" Lab Results  Component Value Date   HGBA1C 5.4 01/11/2022   Lab Results  Component Value Date   WBC 11.6 (H) 12/13/2022   HGB 12.0 12/13/2022   HCT 35.7 (L) 12/13/2022   MCV 88.4 12/13/2022   PLT 263 12/13/2022   Lab Results  Component Value Date   ALT 15 08/30/2023   AST 15 08/30/2023   ALKPHOS 120 08/30/2023   BILITOT 0.6 08/30/2023   No results found for: "25OHVITD2", "25OHVITD3", "VD25OH"   Review of Systems  Constitutional: Negative.  Negative for chills, diaphoresis, fatigue, fever and unexpected weight change.  HENT:  Negative for congestion, ear discharge, ear pain, rhinorrhea, sinus pressure, sneezing and sore throat.   Respiratory:  Negative for cough,  shortness of breath, wheezing and stridor.   Cardiovascular:  Positive for leg swelling.  Gastrointestinal:  Negative for abdominal pain, blood in stool, constipation, diarrhea and nausea.  Genitourinary:  Negative for dysuria, flank pain, frequency, hematuria, urgency and vaginal discharge.  Musculoskeletal:  Positive for arthralgias and myalgias. Negative for back pain.  Skin:  Positive for rash.  Neurological:  Negative for dizziness, weakness and headaches.  Hematological:  Negative for adenopathy. Does not bruise/bleed easily.  Psychiatric/Behavioral:  Negative for dysphoric mood. The patient is not nervous/anxious.     Patient Active Problem List   Diagnosis Date Noted   Impaired fasting glucose 12/13/2022   Obesity (BMI 30-39.9) 12/13/2022   Peritonsillar abscess 12/12/2022   Hyperlipidemia 02/23/2021   Reactive airway disease 02/23/2021   History of colonic polyps    Encounter for screening colonoscopy    Benign neoplasm of cecum    History of migraine 07/23/2014   Menorrhagia 06/02/2014    No Known Allergies  Past Surgical History:  Procedure Laterality Date   ABLATION  2015   uterine   CATARACT EXTRACTION W/ INTRAOCULAR LENS IMPLANT Right 07/08/2019   [facility]   COLONOSCOPY WITH PROPOFOL N/A 08/30/2018   Procedure: COLONOSCOPY WITH PROPOFOL;  Surgeon: Midge Minium, MD;  Location: [facility];  Service: Endoscopy;  Laterality: N/A;   COLONOSCOPY WITH PROPOFOL N/A 09/08/2019   Procedure: COLONOSCOPY WITH PROPOFOL;  Surgeon:  Midge Minium, MD;  Location: [facility];  Service: Endoscopy;  Laterality: N/A;   DILATION AND CURETTAGE OF UTERUS  2003   INCISION AND DRAINAGE OF PERITONSILLAR ABCESS Left 12/12/2022   Procedure: INCISION AND DRAINAGE OF PERITONSILLAR ABCESS;  Surgeon: Linus Salmons, MD;  Location: [facility];  Service: ENT;  Laterality: Left;   POLYPECTOMY  08/30/2018   Procedure: POLYPECTOMY;  Surgeon: Midge Minium, MD;  Location: [facility];   Service: Endoscopy;;   POLYPECTOMY N/A 09/08/2019   Procedure: POLYPECTOMY;  Surgeon: Midge Minium, MD;  Location: [facility];  Service: Endoscopy;  Laterality: N/A;    Social History   Tobacco Use   Smoking status: Former    Current packs/day: 0.00    Types: Cigarettes    Quit date: 1997    Years since quitting: 28.1   Smokeless tobacco: Never  Vaping Use   Vaping status: Never Used  Substance Use Topics   Alcohol use: Yes    Alcohol/week: 0.0 standard drinks of alcohol    Comment: occasionally - may have 2-4 drinks/month   Drug use: No     Medication list has been reviewed and updated.  Current Meds  Medication Sig   atorvastatin (LIPITOR) 20 MG tablet Take 1 tablet (20 mg total) by mouth daily.   Cyanocobalamin (VITAMIN B-12 PO) Take by mouth daily.   levalbuterol (XOPENEX HFA) 45 MCG/ACT inhaler Inhale 2 puffs into the lungs every 6 (six) hours as needed for wheezing.   Melatonin 5 MG TABS Take 5 mg by mouth at bedtime as needed.   Multiple Vitamin (MULTIVITAMIN) tablet Take 1 tablet by mouth daily.   Omega-3 Fatty Acids (FISH OIL PO) Take by mouth daily.       03/09/2023    9:55 AM 09/08/2022    8:03 AM 08/08/2022    4:31 PM 01/11/2022    7:56 AM  GAD 7 : Generalized Anxiety Score  Nervous, Anxious, on Edge 0 0 0 0  Control/stop worrying 0 0 0 0  Worry too much - different things 0 0 0 0  Trouble relaxing 0 0 0 0  Restless 0 0 0 0  Easily annoyed or irritable 0 0 0 0  Afraid - awful might happen 0 0 0 0  Total GAD 7 Score 0 0 0 0  Anxiety Difficulty Not difficult at all Not difficult at all Not difficult at all Not difficult at all       03/09/2023    9:55 AM 09/08/2022    8:03 AM 08/08/2022    4:31 PM  Depression screen PHQ 2/9  Decreased Interest 0 0 0  Down, Depressed, Hopeless 0 0 0  PHQ - 2 Score 0 0 0  Altered sleeping 0 0 0  Tired, decreased energy 0 0 0  Change in appetite 0 0 0  Feeling bad or failure about yourself  0 0 0  Trouble  concentrating 0 0 0  Moving slowly or fidgety/restless 0 0 0  Suicidal thoughts 0 0 0  PHQ-9 Score 0 0 0  Difficult doing work/chores Not difficult at all Not difficult at all Not difficult at all    BP Readings from Last 3 Encounters:  09/18/23 138/78  08/30/23 110/80  03/09/23 112/64    Physical Exam Vitals and nursing note reviewed.  Constitutional:      General: She is not in acute distress.    Appearance: She is not diaphoretic.  HENT:     Head: Normocephalic  and atraumatic.     Right Ear: Tympanic membrane, ear canal and external ear normal. There is no impacted cerumen.     Left Ear: Tympanic membrane, ear canal and external ear normal. There is no impacted cerumen.     Nose: Nose normal. No congestion or rhinorrhea.     Mouth/Throat:     Mouth: Mucous membranes are moist.  Eyes:     General:        Right eye: No discharge.        Left eye: No discharge.     Conjunctiva/sclera: Conjunctivae normal.     Pupils: Pupils are equal, round, and reactive to light.  Neck:     Thyroid: No thyromegaly.     Vascular: No JVD.  Cardiovascular:     Rate and Rhythm: Normal rate and regular rhythm.     Heart sounds: Normal heart sounds. No murmur heard.    No friction rub. No gallop.  Pulmonary:     Effort: Pulmonary effort is normal.     Breath sounds: Normal breath sounds. No wheezing, rhonchi or rales.  Abdominal:     General: Bowel sounds are normal.     Palpations: Abdomen is soft. There is no mass.     Tenderness: There is no abdominal tenderness. There is no guarding.  Musculoskeletal:        General: Normal range of motion.     Cervical back: Normal range of motion and neck supple.  Lymphadenopathy:     Cervical: No cervical adenopathy.  Skin:    General: Skin is warm and dry.     Findings: Rash present.     Comments: Rash is generalized erythematous macular on the arms and is reticular on the thighs.  Patient has a facial malar erythema on the cheeks bilaterally.   Neurological:     Mental Status: She is alert.     Deep Tendon Reflexes: Reflexes are normal and symmetric.     Wt Readings from Last 3 Encounters:  09/18/23 192 lb (87.1 kg)  08/30/23 192 lb (87.1 kg)  03/09/23 190 lb (86.2 kg)    BP 138/78   Pulse 91   Resp 16   Ht 5\' 7"  (1.702 m)   Wt 192 lb (87.1 kg)   SpO2 97%   BMI 30.07 kg/m   Assessment and Plan:  1. Sore throat (Primary) Patient with sore throat rash swelling in the ankles we will rule out strep in the event that this could be streptococcal glomerular nephritis.  Rapid strep is negative we will proceed with culture - POCT rapid strep A - Culture, Group A Strep - CBC with Differential/Platelet - Renal Function Panel  2. Viral exanthem Exanthem has characteristics of erythema infectiosum and scarlet fever.  Unable to differentiate we will obtain a CBC to see if there is a viral shift in the differential and check renal panel to make sure that there is no issue with renal concern. - Culture, Group A Strep - CBC with Differential/Platelet - Renal Function Panel  3. hematuria Patient has large amount of blood with no vaginal bleeding.  Will obtain a urine culture and obtain renal function panel to rule out elevated creatinine and decreased GFR. - Urine Culture - POCT Urinalysis Dipstick    Elizabeth Sauer, MD

## 2023-09-21 LAB — CULTURE, GROUP A STREP: Strep A Culture: NEGATIVE

## 2023-09-27 LAB — SPECIMEN STATUS REPORT

## 2023-09-27 LAB — URINE CULTURE

## 2023-12-21 ENCOUNTER — Telehealth: Payer: Self-pay

## 2023-12-21 NOTE — Telephone Encounter (Signed)
 Jacqueline Pham

## 2024-03-04 ENCOUNTER — Other Ambulatory Visit: Payer: Self-pay | Admitting: Obstetrics and Gynecology

## 2024-03-04 DIAGNOSIS — Z1231 Encounter for screening mammogram for malignant neoplasm of breast: Secondary | ICD-10-CM

## 2024-03-10 ENCOUNTER — Encounter: Admitting: Student

## 2024-03-11 ENCOUNTER — Encounter: Admitting: Student

## 2024-03-21 ENCOUNTER — Ambulatory Visit: Payer: Self-pay

## 2024-03-21 ENCOUNTER — Ambulatory Visit: Admitting: Family Medicine

## 2024-03-21 ENCOUNTER — Encounter: Payer: Self-pay | Admitting: Family Medicine

## 2024-03-21 ENCOUNTER — Encounter

## 2024-03-21 ENCOUNTER — Ambulatory Visit
Admission: RE | Admit: 2024-03-21 | Discharge: 2024-03-21 | Disposition: A | Discharge status: Home / Self Care | Source: Ambulatory Visit | Attending: Family Medicine | Admitting: Family Medicine

## 2024-03-21 ENCOUNTER — Ambulatory Visit
Admission: RE | Admit: 2024-03-21 | Discharge: 2024-03-21 | Disposition: A | Discharge status: Home / Self Care | Attending: Family Medicine | Admitting: Family Medicine

## 2024-03-21 VITALS — BP 114/78 | HR 75 | Ht 67.0 in | Wt 193.6 lb

## 2024-03-21 DIAGNOSIS — R1011 Right upper quadrant pain: Secondary | ICD-10-CM

## 2024-03-21 DIAGNOSIS — K5909 Other constipation: Secondary | ICD-10-CM | POA: Diagnosis not present

## 2024-03-21 MED ORDER — ONDANSETRON 4 MG PO TBDP: 4.0000 mg | ORAL_TABLET | Freq: Three times a day (TID) | ORAL | 0 refills | Status: AC | PRN

## 2024-03-21 NOTE — Telephone Encounter (Signed)
 FYI Only or Action Required?: Action required by provider: request for appointment.  Patient was last seen in primary care on 09/18/2023 by Joshua Cathryne BROCKS, MD.  Called Nurse Triage reporting Abdominal Pain.  Symptoms began yesterday.  Interventions attempted: Nothing.  Symptoms are: gradually worsening.Upper abdomen at ribs. Pain 9/10. No fever or vomiting.  Triage Disposition: See Physician Within 4 Hours (or PCP Triage)  Patient/caregiver understands and will follow disposition?:     Copied from CRM [id number]. Topic: Clinical - Red Word Triage >> Mar 21, 2024 10:05 AM Dawna HERO wrote: Red Word that prompted transfer to Nurse Triage: severe pain in upper abdomen , had cataract surgery yesterday , scale from 1-10 says its a 9 . Wanted to know if she should go to ER or to provider Answer Assessment - Initial Assessment Questions 1. LOCATION: Where does it hurt?      Upper abdomen 2. RADIATION: Does the pain shoot anywhere else? (e.g., chest, back)     no 3. ONSET: When did the pain begin? (e.g., minutes, hours or days ago)      today 4. SUDDEN: Gradual or sudden onset?     sudden 5. PATTERN Does the pain come and go, or is it constant?     constant 6. SEVERITY: How bad is the pain?  (e.g., Scale 1-10; mild, moderate, or severe)     9 7. RECURRENT SYMPTOM: Have you ever had this type of stomach pain before? If Yes, ask: When was the last time? and What happened that time?      no 8. CAUSE: What do you think is causing the stomach pain? (e.g., gallstones, recent abdominal surgery)     unsure 9. RELIEVING/AGGRAVATING FACTORS: What makes it better or worse? (e.g., antacids, bending or twisting motion, bowel movement)     no 10. OTHER SYMPTOMS: Do you have any other symptoms? (e.g., back pain, diarrhea, fever, urination pain, vomiting)       no 11. PREGNANCY: Is there any chance you are pregnant? When was your last menstrual period?       no  Protocols  used: Abdominal Pain - [facility]

## 2024-03-21 NOTE — Assessment & Plan Note (Signed)
 History of Present Illness Jacqueline Pham is a 58 year old female who presents with acute abdominal pain and nausea.  Acute abdominal pain and nausea - Onset this morning, following awakening at 2 AM (her usual time) - Pain intensity rated 6-7/10 - Pain localized to the right abdominal area with downward radiation to RLQ - Pain worsened after eating breakfast - Lying down initially alleviated symptoms, but pain intensified postprandially - Associated with nausea - No recent intake of spicy foods or coffee  Gastrointestinal symptoms - Bowel movements typically regular, at least twice daily - Slight change in stool color to a darker shade - No significant change in stool frequency or consistency, though stools have been slightly looser over the past couple of days  Recent surgical history and medication use - Underwent cataract surgery 24 hours prior to symptom onset - Currently using eye drops post-cataract surgery - Frequent Tylenol  use in preparation for upcoming hip surgery - No other regular medications  Dietary intake - Recent diet includes banana, Sprite, last night hamburger steak, round potatoes, and a honey bun  Genitourinary symptoms - No bladder issues or urinary pain - No significant changes in urinary habits - 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 ABDOMEN INSPECTION: Abdomen nondistended, no visible lesions, or hernias AUSCULTATION: Hypoactive bowel sounds present in all quadrants PERCUSSION: No tympany or shifting dullness; no evidence of hepatosplenomegaly on percussion PALPATION: Left lower quadrant with mild tenderness; right lower quadrant with significant tenderness; right upper quadrant with significant tenderness, including midway between right upper and right lower quadrants where there is maximal tenderness; Murphy's sign negative; McBurney's point nontender; no rebound or guarding appreciated; no hepatosplenomegaly CVA  TENDERNESS: Mild right-sided costovertebral angle tenderness; left CVA nontender  Results RADIOLOGY Abdominal X-ray: Significant fecal loading in the right side of the bowel with areas of radiolucency consistent with gas, no nephrolithiasis observed (03/21/2024)  Assessment and Plan Fecal load with associated abdominal pain Acute fecal impaction with significant abdominal pain, primarily in the right lower quadrant and mid-abdomen. No evidence of kidney stones on imaging. Differential diagnosis includes potential gas buildup contributing to pain. No signs of rebound tenderness or Murphy's sign, indicating no acute surgical abdomen. The impaction is likely due to dietary changes, decreased fluid intake, and recent surgery. - Prescribe Miralax to soften stool and facilitate bowel movement. - Recommend magnesium hydroxide or citrate to stimulate bowel movement. - Advise against immediate use of strong laxatives like Senna or Dulcolax until stool is softened. - Suggest simethicone  (Gas-X) for gas pain relief. - Encourage hydration with fluids containing electrolytes, such as Gatorade or broth. - Advise gentle physical activity as tolerated to aid bowel movement. - Instruct to avoid anti-inflammatory medications that may aggravate the stomach. - Advise ER visit if severe, persistent pain occurs.  Nausea secondary to fecal load Nausea likely secondary to fecal impaction and associated abdominal discomfort. Discussed the role of nausea as a symptom of bowel obstruction and the importance of managing it to improve comfort and facilitate oral intake. - Prescribe Zofran  to manage nausea. - Advise dietary modifications to include easily digestible foods until bowel movement occurs.

## 2024-03-21 NOTE — Telephone Encounter (Signed)
 Noted  Pt has a appt.  KP

## 2024-03-21 NOTE — Patient Instructions (Signed)
 Patient Plan for Post-Visit Guidance  Fecal Impaction and Abdominal Pain - Take Miralax as prescribed to soften stool and help with bowel movement. - Use magnesium hydroxide or magnesium citrate to stimulate a bowel movement. - Do not use strong laxatives like Senna or Dulcolax until stool is softened. - Take simethicone  (Gas-X) as needed for gas pain. - Drink plenty of fluids with electrolytes, such as Gatorade or broth. - Eat easily digestible foods until you have a bowel movement. - Engage in gentle physical activity as tolerated to help stimulate bowel movement. - Avoid anti-inflammatory medications that may upset your stomach.  Nausea - Take Zofran  as prescribed to manage nausea.  Red Flags - Go to the emergency room if you have severe or persistent abdominal pain, vomiting that will not stop, inability to pass stool or gas, fever, or any new or worsening symptoms.

## 2024-03-21 NOTE — Progress Notes (Signed)
 Primary Care / Sports Medicine Office Visit  Patient Information:  Patient ID: Jacqueline Pham, female DOB: 02/25/1966 Age: 58 y.o. MRN: [id number]   Jacqueline Pham is a pleasant 58 y.o. female presenting with the following:  Chief Complaint  Patient presents with   Abdominal Pain    RUQ pain since 6 am. Constant pain , can be sharp and intense. Patient has not taken any medications for pain. She is not taking any medication at all right now do to eye surgery yesterday.     Vitals:   03/21/24 1059  BP: 114/78  Pulse: 75  SpO2: 98%   Vitals:   03/21/24 1059  Weight: 193 lb 9.6 oz (87.8 kg)  Height: 5' 7 (1.702 m)   Body mass index is 30.32 kg/m.  No results found.   Independent interpretation of notes and tests performed by another provider:   None  Procedures performed:   None  Pertinent History, Exam, Impression, and Recommendations:   Problem List Items Addressed This Visit     Right upper quadrant abdominal pain - Primary   History of Present Illness Jacqueline Pham is a 58 year old female who presents with acute abdominal pain and nausea.  Acute abdominal pain and nausea - Onset this morning, following awakening at 2 AM (her usual time) - Pain intensity rated 6-7/10 - Pain localized to the right abdominal area with downward radiation to RLQ - Pain worsened after eating breakfast - Lying down initially alleviated symptoms, but pain intensified postprandially - Associated with nausea - No recent intake of spicy foods or coffee  Gastrointestinal symptoms - Bowel movements typically regular, at least twice daily - Slight change in stool color to a darker shade - No significant change in stool frequency or consistency, though stools have been slightly looser over the past couple of days  Recent surgical history and medication use - Underwent cataract surgery 24 hours prior to symptom onset - Currently using eye drops post-cataract surgery -  Frequent Tylenol  use in preparation for upcoming hip surgery - No other regular medications  Dietary intake - Recent diet includes banana, Sprite, last night hamburger steak, round potatoes, and a honey bun  Genitourinary symptoms - No bladder issues or urinary pain - No significant changes in urinary habits - 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 ABDOMEN INSPECTION: Abdomen nondistended, no visible lesions, or hernias AUSCULTATION: Hypoactive bowel sounds present in all quadrants PERCUSSION: No tympany or shifting dullness; no evidence of hepatosplenomegaly on percussion PALPATION: Left lower quadrant with mild tenderness; right lower quadrant with significant tenderness; right upper quadrant with significant tenderness, including midway between right upper and right lower quadrants where there is maximal tenderness; Murphy's sign negative; McBurney's point nontender; no rebound or guarding appreciated; no hepatosplenomegaly CVA TENDERNESS: Mild right-sided costovertebral angle tenderness; left CVA nontender  Results RADIOLOGY Abdominal X-ray: Significant fecal loading in the right side of the bowel with areas of radiolucency consistent with gas, no nephrolithiasis observed (03/21/2024)  Assessment and Plan Fecal load with associated abdominal pain Acute fecal impaction with significant abdominal pain, primarily in the right lower quadrant and mid-abdomen. No evidence of kidney stones on imaging. Differential diagnosis includes potential gas buildup contributing to pain. No signs of rebound tenderness or Murphy's sign, indicating no acute surgical abdomen. The impaction is likely due to dietary changes, decreased fluid intake, and recent surgery. - Prescribe Miralax to soften stool and facilitate bowel movement. - Recommend  magnesium hydroxide or citrate to stimulate bowel movement. - Advise against immediate use of strong laxatives like Senna or Dulcolax until  stool is softened. - Suggest simethicone  (Gas-X) for gas pain relief. - Encourage hydration with fluids containing electrolytes, such as Gatorade or broth. - Advise gentle physical activity as tolerated to aid bowel movement. - Instruct to avoid anti-inflammatory medications that may aggravate the stomach. - Advise ER visit if severe, persistent pain occurs.  Nausea secondary to fecal load Nausea likely secondary to fecal impaction and associated abdominal discomfort. Discussed the role of nausea as a symptom of bowel obstruction and the importance of managing it to improve comfort and facilitate oral intake. - Prescribe Zofran  to manage nausea. - Advise dietary modifications to include easily digestible foods until bowel movement occurs.      Relevant Medications   ondansetron  (ZOFRAN -ODT) 4 MG disintegrating tablet     Orders & Medications Medications:  Meds ordered this encounter  Medications   ondansetron  (ZOFRAN -ODT) 4 MG disintegrating tablet    Sig: Take 1 tablet (4 mg total) by mouth every 8 (eight) hours as needed for up to 10 days for nausea or vomiting.    Dispense:  20 tablet    Refill:  0   Orders Placed This Encounter  Procedures   DG Abd 1 View     No follow-ups on file.     Selinda JINNY Ku, MD, [facility]   Primary Care Sports Medicine [facility] Medicine at [facility]

## 2024-04-09 ENCOUNTER — Other Ambulatory Visit: Payer: Self-pay | Admitting: Family Medicine

## 2024-04-09 DIAGNOSIS — K5909 Other constipation: Secondary | ICD-10-CM

## 2024-04-10 NOTE — Telephone Encounter (Signed)
 Requested medications are due for refill today.  unsure  Requested medications are on the active medications list.  yes  Last refill. 03/21/2024   Future visit scheduled.   yes  Notes to clinic.  Refill not delegated.     Requested Prescriptions  Pending Prescriptions Disp Refills   ondansetron  (ZOFRAN -ODT) 4 MG disintegrating tablet [Pharmacy Med Name: ONDANSETRON  ODT 4 MG TABLET] 18 tablet 1    Sig: Take 1 tablet (4 mg total) by mouth every 8 (eight) hours as needed for up to 10 days for nausea or vomiting.     Not Delegated - Gastroenterology: Antiemetics - ondansetron  Failed - 04/10/2024 11:43 AM      Failed - This refill cannot be delegated      Failed - Valid encounter within last 6 months    Recent Outpatient Visits           2 weeks ago Other constipation   [place] [facility], Selinda PARAS, MD   6 months ago Sore throat   [place] [facility] Joshua Cathryne JAYSON, MD   7 months ago Flu-like symptoms   [place] [facility], MD              Passed - AST in normal range and within 360 days    AST  Date Value Ref Range Status  08/30/2023 15 0 - 40 IU/L Final         Passed - ALT in normal range and within 360 days    ALT  Date Value Ref Range Status  08/30/2023 15 0 - 32 IU/L Final

## 2024-04-11 ENCOUNTER — Encounter: Admitting: Student

## 2024-05-05 ENCOUNTER — Telehealth: Payer: Self-pay

## 2024-05-05 NOTE — Telephone Encounter (Signed)
 Pt call office to schedule her colonoscopy.  Chart reviewed-colonoscopy due 09/07/24 Dr. Jinny.  Pt has been advised that I will call her back closer to the date of her colonoscopy.  Thanks,  [place], CMA

## 2024-05-06 ENCOUNTER — Ambulatory Visit: Admitting: Physical Therapy

## 2024-05-06 NOTE — Therapy (Deleted)
 OUTPATIENT PHYSICAL THERAPY THORACOLUMBAR EVALUATION   Patient Name: Jacqueline Pham MRN: [id number] DOB:1966-03-13, 58 y.o., female Today's Date: 05/06/2024  END OF SESSION:   Past Medical History:  Diagnosis Date   Asthma    Wears contact lenses    left eye only   Past Surgical History:  Procedure Laterality Date   ABLATION  2015   uterine   CATARACT EXTRACTION W/ INTRAOCULAR LENS IMPLANT Right 07/08/2019   [facility]   COLONOSCOPY WITH PROPOFOL  N/A 08/30/2018   Procedure: COLONOSCOPY WITH PROPOFOL ;  Surgeon: Jinny Carmine, MD;  Location: [facility];  Service: Endoscopy;  Laterality: N/A;   COLONOSCOPY WITH PROPOFOL  N/A 09/08/2019   Procedure: COLONOSCOPY WITH PROPOFOL ;  Surgeon: Jinny Carmine, MD;  Location: [facility];  Service: Endoscopy;  Laterality: N/A;   DILATION AND CURETTAGE OF UTERUS  2003   INCISION AND DRAINAGE OF PERITONSILLAR ABCESS Left 12/12/2022   Procedure: INCISION AND DRAINAGE OF PERITONSILLAR ABCESS;  Surgeon: Herminio Miu, MD;  Location: [facility] OR[facility];  Service: ENT;  Laterality: Left;   POLYPECTOMY  08/30/2018   Procedure: POLYPECTOMY;  Surgeon: Jinny Carmine, MD;  Location: [facility];  Service: Endoscopy;;   POLYPECTOMY N/A 09/08/2019   Procedure: POLYPECTOMY;  Surgeon: Jinny Carmine, MD;  Location: [facility];  Service: Endoscopy;  Laterality: N/A;   Patient Active Problem List   Diagnosis Date Noted   Right upper quadrant abdominal pain 03/21/2024   Impaired fasting glucose 12/13/2022   Obesity (BMI 30-39.9) 12/13/2022   Peritonsillar abscess 12/12/2022   Hyperlipidemia 02/23/2021   Reactive airway disease 02/23/2021   History of colonic polyps    Encounter for screening colonoscopy    Benign neoplasm of cecum    History of migraine 07/23/2014   Menorrhagia 06/02/2014    PCP: Alvia Selinda PARAS, MD  REFERRING PROVIDER: Alvia Selinda PARAS, MD  REFERRING DIAG: ***  RATIONALE FOR EVALUATION AND TREATMENT:  {HABREHAB:27488}  THERAPY DIAG: No diagnosis found.  ONSET DATE: ***  FOLLOW-UP APPT SCHEDULED WITH REFERRING PROVIDER: {yes/no:20286}    SUBJECTIVE:                                                                                                                                                                                         SUBJECTIVE STATEMENT:  ***  PERTINENT HISTORY: ***  PAIN:    Pain Intensity: Present: /10, Best: /10, Worst: /10 Pain location: *** Pain Quality: {PAIN DESCRIPTION:21022940}  Radiating: {yes/no:20286}  Numbness/Tingling: {yes/no:20286} Focal Weakness: {yes/no:20286} Aggravating factors: *** Relieving factors: *** 24-hour pain behavior: *** How long can you sit: How long can you stand: History of prior back injury, pain, surgery,  or therapy: {yes/no:20286} Dominant hand: {RIGHT/LEFT:20294} Imaging: {yes/no:20286}  Red flags: Negative for bowel/bladder changes, saddle paresthesia, personal history of cancer, h/o spinal tumors, h/o compression fx, h/o abdominal aneurysm, abdominal pain, chills/fever, night sweats, nausea, vomiting, unrelenting pain, first onset of insidious LBP <20 y/o  PRECAUTIONS: {Therapy precautions:24002}  WEIGHT BEARING RESTRICTIONS: {Yes ***/No:24003}  FALLS: Has patient fallen in last 6 months? {fallsyesno:27318}  Living Environment Lives with: {[facility] lives with:25569::lives with their family} Lives in: {Lives in:25570} Stairs: {opstairs:27293} Has following equipment at home: {Assistive devices:23999}  Prior level of function: {PLOF:24004}  Occupational demands:   Hobbies:   Patient Goals: ***   OBJECTIVE:  Patient Surveys  {rehab surveys:24030}  Cognition Patient is oriented to person, place, and time.  Recent memory is intact.  Remote memory is intact.  Attention span and concentration are intact.  Expressive speech is intact.  Patient's fund of knowledge is within normal limits for educational  level.    Gross Musculoskeletal Assessment Tremor: None Bulk: Normal Tone: Normal No visible step-off along spinal column, no signs of scoliosis  GAIT: Distance walked: *** Assistive device utilized: {Assistive devices:23999} Level of assistance: {Levels of assistance:24026} Comments: ***  Posture: Lumbar lordosis: WNL Iliac crest height: Equal bilaterally Lumbar lateral shift: Negative  AROM AROM (Normal range in degrees) AROM   Lumbar   Flexion (65)   Extension (30)   Right lateral flexion (25)   Left lateral flexion (25)   Right rotation (30)   Left rotation (30)       Hip Right Left  Flexion (125)    Extension (15)    Abduction (40)    Adduction     Internal Rotation (45)    External Rotation (45)        Knee    Flexion (135)    Extension (0)        Ankle    Dorsiflexion (20)    Plantarflexion (50)    Inversion (35)    Eversion (15)    (* = pain; Blank rows = not tested)  LE MMT: MMT (out of 5) Right  Left   Hip flexion    Hip extension    Hip abduction    Hip adduction    Hip internal rotation    Hip external rotation    Knee flexion    Knee extension    Ankle dorsiflexion    Ankle plantarflexion    Ankle inversion    Ankle eversion    (* = pain; Blank rows = not tested)  Sensation Grossly intact to light touch throughout bilateral LEs as determined by testing dermatomes L2-S2. Proprioception, stereognosis, and hot/cold testing deferred on this date.  Reflexes R/L Knee Jerk (L3/4): 2+/2+  Ankle Jerk (S1/2): 2+/2+   Muscle Length Hamstrings: R: {NEGATIVE/POSITIVE QNM:80001} L: {NEGATIVE/POSITIVE QNM:80001} Ely (quadriceps): R: {NEGATIVE/POSITIVE QNM:80001} L: {NEGATIVE/POSITIVE QNM:80001} Thomas (hip flexors): R: {NEGATIVE/POSITIVE QNM:80001} L: {NEGATIVE/POSITIVE QNM:80001} Ober: R: {NEGATIVE/POSITIVE QNM:80001} L: {NEGATIVE/POSITIVE QNM:80001}  Palpation Location Right Left         Lumbar paraspinals    Quadratus Lumborum     Gluteus Maximus    Gluteus Medius    Deep hip external rotators    PSIS    Fortin's Area (SIJ)    Greater Trochanter    (Blank rows = not tested) Graded on 0-4 scale (0 = no pain, 1 = pain, 2 = pain with wincing/grimacing/flinching, 3 = pain with withdrawal, 4 = unwilling to allow palpation)  Passive Accessory  Motion Pt  denies reproduction of back pain with CPA L1-L5 and UPA bilaterally L1-L5. Generally, hypomobile throughout  Special Tests Lumbar Radiculopathy and Discogenic: Centralization and Peripheralization (SN 92, -LR 0.12): {NEGATIVE/POSITIVE FOR:19998} Slump (SN 83, -LR 0.32): R: {NEGATIVE/POSITIVE FOR:19998} L: {NEGATIVE/POSITIVE FOR:19998} SLR (SN 92, -LR 0.29): R: {NEGATIVE/POSITIVE QNM:80001} L:  {NEGATIVE/POSITIVE QNM:80001} Crossed SLR (SP 90): R: {NEGATIVE/POSITIVE QNM:80001} L: {NEGATIVE/POSITIVE QNM:80001}  Facet Joint: Extension-Rotation (SN 100, -LR 0.0): R: {NEGATIVE/POSITIVE QNM:80001} L: {NEGATIVE/POSITIVE QNM:80001}  Lumbar Foraminal Stenosis: Lumbar quadrant (SN 70): R: {NEGATIVE/POSITIVE QNM:80001} L: {NEGATIVE/POSITIVE QNM:80001}  Hip: FABER (SN 81): R: {NEGATIVE/POSITIVE QNM:80001} L: {NEGATIVE/POSITIVE QNM:80001} FADIR (SN 94): R: {NEGATIVE/POSITIVE FOR:19998} L: {NEGATIVE/POSITIVE QNM:80001} Hip scour (SN 50): R: {NEGATIVE/POSITIVE QNM:80001} L: {NEGATIVE/POSITIVE QNM:80001}  SIJ:  Thigh Thrust (SN 88, -LR 0.18) : R: {NEGATIVE/POSITIVE QNM:80001} L: {NEGATIVE/POSITIVE QNM:80001}  Piriformis Syndrome: FAIR Test (SN 88, SP 83): R: {NEGATIVE/POSITIVE QNM:80001} L: {NEGATIVE/POSITIVE QNM:80001}  Functional Tasks Lifting: Deep squat: Sit to stand: Forward Step-Down Test: R:  L:  Lateral Step-Down Test: R:  L:   Beighton scale  LEFT  RIGHT           1. Passive dorsiflexion and hyperextension of the fifth MCP joint beyond 90  0 0   2. Passive apposition of the thumb to the flexor aspect of the forearm  0  0   3. Passive hyperextension of the  elbow beyond 10  0  0   4. Passive hyperextension of the knee beyond 10  0  0   5. Active forward flexion of the trunk with the knees fully extended so that the palms of the hands rest flat on the floor   0   TOTAL         0/ 9      TODAY'S TREATMENT: DATE: ***     PATIENT EDUCATION:  Education details: *** Person educated: {Person educated:25204} Education method: {Education Method:25205} Education comprehension: {Education Comprehension:25206}   HOME EXERCISE PROGRAM:     ASSESSMENT:  CLINICAL IMPRESSION: Patient is a *** y.o. *** who was seen today for physical therapy evaluation and treatment for ***.   OBJECTIVE IMPAIRMENTS: {opptimpairments:25111}.   ACTIVITY LIMITATIONS: {activitylimitations:27494}  PARTICIPATION LIMITATIONS: {participationrestrictions:25113}  PERSONAL FACTORS: {Personal factors:25162} are also affecting patient's functional outcome.   REHAB POTENTIAL: {rehabpotential:25112}  CLINICAL DECISION MAKING: {clinical decision making:25114}  EVALUATION COMPLEXITY: {Evaluation complexity:25115}   GOALS: Goals reviewed with patient? Yes  SHORT TERM GOALS: Target date: 05/27/2024  Pt will be independent with HEP in order to improve strength and decrease back pain to improve pain-free function at home and work. Baseline: *** Goal status: INITIAL   LONG TERM GOALS: Target date: {follow up:25551}  Pt will  Baseline:  Goal status: INITIAL  2.  Pt will have worst pain 1-2/10 for post-op hip indicative of clinically meaningful change in NPRS and no limitation in functional mobility/ADL performance due to pain.  Baseline: 05/06/24: *** Goal status: INITIAL  3.  Pt will have LEFS 60/80 or greater indicative of clinically meaningful improvement in pt function and low disability related to lower extremity function   Baseline: 05/06/24: *** Goal status: INITIAL  4.  *** Baseline: *** Goal status: INITIAL   PLAN: PT FREQUENCY:  1-2x/week  PT DURATION: 6-8 weeks  PLANNED INTERVENTIONS: Therapeutic exercises, Therapeutic activity, Neuromuscular re-education, Balance training, Gait training, Patient/Family education, Self Care, Joint mobilization, Joint manipulation, Vestibular training, Canalith repositioning, Orthotic/Fit training, DME instructions, Dry Needling, Electrical stimulation, Spinal manipulation, Spinal mobilization, Cryotherapy, Moist heat, Taping, Traction, Ultrasound, Ionotophoresis 4mg /ml  Dexamethasone , Manual therapy, and Re-evaluation.  PLAN FOR NEXT SESSION: ***   Venetia Endo, PT, DPT [phone]  Venetia ONEIDA Endo, PT 05/06/2024, 8:31 AM

## 2024-05-08 ENCOUNTER — Encounter

## 2024-05-13 ENCOUNTER — Encounter

## 2024-05-14 ENCOUNTER — Ambulatory Visit: Admitting: Student

## 2024-05-14 ENCOUNTER — Encounter: Payer: Self-pay | Admitting: Student

## 2024-05-14 VITALS — BP 118/74 | HR 88 | Ht 67.0 in | Wt 196.0 lb

## 2024-05-14 DIAGNOSIS — Z96641 Presence of right artificial hip joint: Secondary | ICD-10-CM

## 2024-05-14 DIAGNOSIS — J4520 Mild intermittent asthma, uncomplicated: Secondary | ICD-10-CM | POA: Diagnosis not present

## 2024-05-14 DIAGNOSIS — R400 Somnolence: Secondary | ICD-10-CM | POA: Diagnosis not present

## 2024-05-14 DIAGNOSIS — E782 Mixed hyperlipidemia: Secondary | ICD-10-CM | POA: Diagnosis not present

## 2024-05-14 DIAGNOSIS — Z860100 Personal history of colon polyps, unspecified: Secondary | ICD-10-CM

## 2024-05-14 NOTE — Progress Notes (Unsigned)
 Established Patient Office Visit  Subjective   Patient ID: Jacqueline Pham, female    DOB: 1965-12-01  Age: 58 y.o. MRN: [id number]  Chief Complaint  Patient presents with   Establish Care    Patient is here to establish care with new PCP    Hunter Sandralee Molt with medical hx listed below presents today for transfer of care. No acute complaints today. Please refer to problem based charting for further details and assessment and plan of current problem and chronic medical conditions.   Patient Active Problem List   Diagnosis Date Noted   Daytime sleepiness 05/16/2024   S/P total right hip arthroplasty 05/16/2024   Obesity (BMI 30-39.9) 12/13/2022   Hyperlipidemia 02/23/2021   Reactive airway disease 02/23/2021   History of colonic polyps    Benign neoplasm of cecum       ROS Refer to HPI    Objective:     Outpatient Encounter Medications as of 05/14/2024  Medication Sig   acetaminophen  (TYLENOL ) 500 MG tablet Take 500 mg by mouth every 6 (six) hours as needed for moderate pain (pain score 4-6).   aspirin EC 81 MG tablet Take 81 mg by mouth once.   Cyanocobalamin (VITAMIN B-12 PO) Take by mouth daily. (Patient not taking: Reported on 05/14/2024)   levalbuterol (XOPENEX HFA) 45 MCG/ACT inhaler Inhale 2 puffs into the lungs every 6 (six) hours as needed for wheezing. (Patient not taking: Reported on 05/14/2024)   [DISCONTINUED] atorvastatin  (LIPITOR) 20 MG tablet Take 1 tablet (20 mg total) by mouth daily. (Patient not taking: Reported on 05/14/2024)   [DISCONTINUED] Melatonin 5 MG TABS Take 5 mg by mouth at bedtime as needed. (Patient not taking: Reported on 05/14/2024)   [DISCONTINUED] Multiple Vitamin (MULTIVITAMIN) tablet Take 1 tablet by mouth daily. (Patient not taking: Reported on 05/14/2024)   [DISCONTINUED] Omega-3 Fatty Acids (FISH OIL PO) Take by mouth daily. (Patient not taking: Reported on 05/14/2024)   No facility-administered encounter medications on file  as of 05/14/2024.    BP 118/74   Pulse 88   Ht 5' 7 (1.702 m)   Wt 196 lb (88.9 kg)   SpO2 98%   BMI 30.70 kg/m  BP Readings from Last 3 Encounters:  05/14/24 118/74  03/21/24 114/78  09/18/23 138/78    Physical Exam Constitutional:      Appearance: Normal appearance.  HENT:     Mouth/Throat:     Mouth: Mucous membranes are moist.     Pharynx: Oropharynx is clear.  Cardiovascular:     Rate and Rhythm: Normal rate and regular rhythm.  Pulmonary:     Effort: Pulmonary effort is normal.     Breath sounds: No rhonchi or rales.  Abdominal:     General: Abdomen is flat. Bowel sounds are normal. There is no distension.     Palpations: Abdomen is soft.     Tenderness: There is no abdominal tenderness.  Musculoskeletal:        General: Normal range of motion.     Right lower leg: No edema.     Left lower leg: No edema.  Skin:    General: Skin is warm and dry.     Capillary Refill: Capillary refill takes less than 2 seconds.  Neurological:     General: No focal deficit present.     Mental Status: She is alert and oriented to person, place, and time.  Psychiatric:        Mood and Affect: Mood normal.  Behavior: Behavior normal.        05/14/2024    2:18 PM 03/09/2023    9:55 AM 09/08/2022    8:03 AM  Depression screen PHQ 2/9  Decreased Interest 0 0 0  Down, Depressed, Hopeless 0 0 0  PHQ - 2 Score 0 0 0  Altered sleeping 0 0 0  Tired, decreased energy 0 0 0  Change in appetite 0 0 0  Feeling bad or failure about yourself  0 0 0  Trouble concentrating 0 0 0  Moving slowly or fidgety/restless 0 0 0  Suicidal thoughts 0 0 0  PHQ-9 Score 0 0 0  Difficult doing work/chores Not difficult at all Not difficult at all Not difficult at all       05/14/2024    2:18 PM 03/09/2023    9:55 AM 09/08/2022    8:03 AM 08/08/2022    4:31 PM  GAD 7 : Generalized Anxiety Score  Nervous, Anxious, on Edge 0 0 0 0  Control/stop worrying 0 0 0 0  Worry too much - different  things 0 0 0 0  Trouble relaxing 0 0 0 0  Restless 0 0 0 0  Easily annoyed or irritable 0 0 0 0  Afraid - awful might happen 0 0 0 0  Total GAD 7 Score 0 0 0 0  Anxiety Difficulty Not difficult at all Not difficult at all Not difficult at all Not difficult at all    No results found for any visits on 05/14/24.    The 10-year ASCVD risk score (Arnett DK, et al., 2019) is: 1.9%    Assessment & Plan:  Daytime sleepiness Assessment & Plan: Reports hx of snoring and daytime sleepiness, naps daily. Discussed sleep study. She is hesitant about CPAP treatment in necessary but would be willing to try it.    Mild intermittent reactive airway disease without complication Assessment & Plan: Has xopenex 4-6 times year. Has not needed this recently. Will send in airsupra when she needs a refill.   Orders: -     Pulmonary Visit  Mixed hyperlipidemia Assessment & Plan: Has made dietary changes, reducing sugar intake, and chips. Previously on atorvastatin  20 mg daily but feels this causes decrease in grip strength and was dropping things. Denies any hand weakness or difficulties currently. Does not recall and muscle aches or pain at the time she was taking atorvastatin . Lipid panel today she would consider trying rosuvastatin if ASCVD elevated.   Orders: -     Lipid panel  S/P total right hip arthroplasty Assessment & Plan: Completed on 04/17/2024. Is no longer requiring narcotics or mobility aids. Some difficulty steps has 2 steps into the house but otherwise ambulating well.  Doing well on tylenol  and NSAIDs as needed. Has follow up with ortho and upcoming PT.     History of colonic polyps Assessment & Plan: Last colon 09/08/2019 recall in 5 years.  She is working on getting this scheduled with GI.       Return in about 3 months (around 08/14/2024) for physical.    Harlene Saddler, MD

## 2024-05-14 NOTE — Assessment & Plan Note (Signed)
 Has made dietary changes, reducing sugar intake, and chips. Previously on atorvastatin  20 mg daily but feels this causes decrease in grip strength and was dropping things. Denies any hand weakness or difficulties currently. Does not recall and muscle aches or pain at the time she was taking atorvastatin . Lipid panel today she would consider trying rosuvastatin if ASCVD elevated.

## 2024-05-14 NOTE — Assessment & Plan Note (Addendum)
 Has xopenex 4-6 times year. Has not needed this recently. Will send in airsupra when she needs a refill.

## 2024-05-15 ENCOUNTER — Encounter

## 2024-05-16 DIAGNOSIS — R400 Somnolence: Secondary | ICD-10-CM | POA: Insufficient documentation

## 2024-05-16 DIAGNOSIS — Z96641 Presence of right artificial hip joint: Secondary | ICD-10-CM | POA: Insufficient documentation

## 2024-05-16 NOTE — Assessment & Plan Note (Addendum)
 Completed on 04/17/2024. Is no longer requiring narcotics or mobility aids. Some difficulty steps has 2 steps into the house but otherwise ambulating well.  Doing well on tylenol  and NSAIDs as needed. Has follow up with ortho and upcoming PT.

## 2024-05-16 NOTE — Assessment & Plan Note (Signed)
 Last colon 09/08/2019 recall in 5 years.  She is working on getting this scheduled with GI.

## 2024-05-16 NOTE — Assessment & Plan Note (Signed)
 Reports hx of snoring and daytime sleepiness, naps daily. Discussed sleep study. She is hesitant about CPAP treatment in necessary but would be willing to try it.

## 2024-05-20 ENCOUNTER — Encounter

## 2024-05-22 ENCOUNTER — Encounter

## 2024-05-27 ENCOUNTER — Encounter

## 2024-05-29 ENCOUNTER — Encounter

## 2024-06-03 ENCOUNTER — Encounter

## 2024-06-05 ENCOUNTER — Encounter

## 2024-06-10 ENCOUNTER — Encounter

## 2024-06-12 ENCOUNTER — Encounter

## 2024-06-17 ENCOUNTER — Encounter

## 2024-06-24 ENCOUNTER — Encounter

## 2024-06-26 ENCOUNTER — Encounter

## 2024-06-30 NOTE — Telephone Encounter (Signed)
 Left voice message for pt to let her know that Dr. Felicitas scope schedule is filling up quickly and he is now in March at [facility].  Asked her to call office to schedule.  Thanks,  [place], CMA

## 2024-07-01 ENCOUNTER — Encounter

## 2024-07-03 ENCOUNTER — Other Ambulatory Visit: Payer: Self-pay

## 2024-07-03 ENCOUNTER — Encounter

## 2024-07-03 ENCOUNTER — Telehealth: Payer: Self-pay

## 2024-07-03 DIAGNOSIS — Z860100 Personal history of colon polyps, unspecified: Secondary | ICD-10-CM

## 2024-07-03 MED ORDER — NA SULFATE-K SULFATE-MG SULF 17.5-3.13-1.6 GM/177ML PO SOLN: 1.0000 | Freq: Once | ORAL | 0 refills | Status: AC

## 2024-07-03 NOTE — Telephone Encounter (Signed)
 Gastroenterology Pre-Procedure Review  Request Date: 10/20/24 Requesting Physician: Dr. Jinny  PATIENT REVIEW QUESTIONS: The patient responded to the following health history questions as indicated:    1. Are you having any GI issues? no 2. Do you have a personal history of Polyps? yes (last colonoscopy performed by Dr. Jinny 09/08/19) 3. Do you have a family history of Colon Cancer or Polyps? no 4. Diabetes Mellitus? no 5. Joint replacements in the past 12 months?no 6. Major health problems in the past 3 months?no 7. Any artificial heart valves, MVP, or defibrillator?no    MEDICATIONS & ALLERGIES:    Patient reports the following regarding taking any anticoagulation/antiplatelet therapy:   Plavix, Coumadin, Eliquis, Xarelto, Lovenox, Pradaxa, Brilinta, or Effient? no Aspirin? 81 mg daily  Patient confirms/reports the following medications:  Current Outpatient Medications  Medication Sig Dispense Refill   acetaminophen  (TYLENOL ) 500 MG tablet Take 500 mg by mouth every 6 (six) hours as needed for moderate pain (pain score 4-6).     aspirin EC 81 MG tablet Take 81 mg by mouth once.     Cyanocobalamin (VITAMIN B-12 PO) Take by mouth daily. (Patient not taking: Reported on 05/14/2024)     levalbuterol (XOPENEX HFA) 45 MCG/ACT inhaler Inhale 2 puffs into the lungs every 6 (six) hours as needed for wheezing. (Patient not taking: Reported on 05/14/2024) 15 g 5   No current facility-administered medications for this visit.    Patient confirms/reports the following allergies:  Allergies[1]  Orders Placed This Encounter  Procedures   Ambulatory referral to Gastroenterology    Referral Priority:   Routine    Referral Type:   Consultation    Referral Reason:   Specialty Services Required    Referred to Provider:   Jinny Carmine, MD    Number of Visits Requested:   1    AUTHORIZATION INFORMATION Primary Insurance: 1D#: Group #:  Secondary Insurance: 1D#: Group #:  [id number]  INFORMATION: Date: 10/20/24 Time: Location: [facility]     [1] No Known Allergies

## 2024-07-08 ENCOUNTER — Encounter

## 2024-07-10 ENCOUNTER — Encounter

## 2024-07-15 ENCOUNTER — Encounter

## 2024-07-22 ENCOUNTER — Encounter

## 2024-07-28 ENCOUNTER — Inpatient Hospital Stay: Admission: RE | Admit: 2024-07-28 | Source: Ambulatory Visit

## 2024-07-29 ENCOUNTER — Encounter

## 2024-07-30 ENCOUNTER — Encounter: Payer: Self-pay | Admitting: Radiology

## 2024-07-30 ENCOUNTER — Ambulatory Visit
Admission: RE | Admit: 2024-07-30 | Discharge: 2024-07-30 | Disposition: A | Discharge status: Home / Self Care | Source: Ambulatory Visit | Attending: Obstetrics and Gynecology | Admitting: Obstetrics and Gynecology

## 2024-07-30 DIAGNOSIS — Z1231 Encounter for screening mammogram for malignant neoplasm of breast: Secondary | ICD-10-CM | POA: Insufficient documentation

## 2024-07-31 ENCOUNTER — Encounter

## 2024-08-13 ENCOUNTER — Other Ambulatory Visit: Payer: Self-pay | Admitting: *Deleted

## 2024-08-13 ENCOUNTER — Encounter: Admitting: Student

## 2024-08-13 ENCOUNTER — Encounter: Payer: Self-pay | Admitting: Student

## 2024-08-13 ENCOUNTER — Inpatient Hospital Stay
Admission: RE | Admit: 2024-08-13 | Discharge: 2024-08-13 | Disposition: A | Payer: Self-pay | Source: Ambulatory Visit | Attending: Student

## 2024-08-13 DIAGNOSIS — Z1231 Encounter for screening mammogram for malignant neoplasm of breast: Secondary | ICD-10-CM

## 2024-08-27 ENCOUNTER — Encounter: Payer: Self-pay | Admitting: Student

## 2024-08-27 ENCOUNTER — Ambulatory Visit: Admitting: Student

## 2024-08-27 VITALS — BP 120/74 | HR 85 | Ht 67.0 in | Wt 202.0 lb

## 2024-08-27 DIAGNOSIS — Z0000 Encounter for general adult medical examination without abnormal findings: Secondary | ICD-10-CM

## 2024-08-27 DIAGNOSIS — E782 Mixed hyperlipidemia: Secondary | ICD-10-CM

## 2024-08-27 DIAGNOSIS — R400 Somnolence: Secondary | ICD-10-CM

## 2024-08-27 DIAGNOSIS — J4520 Mild intermittent asthma, uncomplicated: Secondary | ICD-10-CM

## 2024-08-27 MED ORDER — ALBUTEROL SULFATE HFA 108 (90 BASE) MCG/ACT IN AERS: 2.0000 | INHALATION_SPRAY | Freq: Four times a day (QID) | RESPIRATORY_TRACT | 3 refills | Status: AC | PRN

## 2024-08-27 NOTE — Progress Notes (Unsigned)
 "  Complete physical exam  Patient: Jacqueline Pham   DOB: 1966-03-11   59 y.o. Female  MRN: [id number]  Subjective:    Chief Complaint  Patient presents with   Annual Exam    Jacqueline Pham is a 59 y.o. female who presents today for a complete physical exam. She reports consuming a general diet. Going PT for R hip and seeing a personal trainer She generally feels well. She reports sleeping well. She does have additional problems to discuss today.   {VISON DENTAL STD PSA (Optional):27386}  Patient Active Problem List   Diagnosis Date Noted   Daytime sleepiness 05/16/2024   S/P total right hip arthroplasty 05/16/2024   Obesity (BMI 30-39.9) 12/13/2022   Hyperlipidemia 02/23/2021   Reactive airway disease 02/23/2021   History of colonic polyps    Benign neoplasm of cecum       Patient Care Team: Lemon Raisin, MD as PCP - General (Internal Medicine)   Show/hide medication list[1]  ROS Refer to HPI     Objective:    Ht 5' 7 (1.702 m)   Wt 202 lb (91.6 kg)   LMP 03/25/2020   BMI 31.64 kg/m  BP Readings from Last 3 Encounters:  05/14/24 118/74  03/21/24 114/78  09/18/23 138/78    Physical Exam      05/14/2024    2:18 PM 03/09/2023    9:55 AM 09/08/2022    8:03 AM  Depression screen PHQ 2/9  Decreased Interest 0 0 0  Down, Depressed, Hopeless 0 0 0  PHQ - 2 Score 0 0 0  Altered sleeping 0 0 0  Tired, decreased energy 0 0 0  Change in appetite 0 0 0  Feeling bad or failure about yourself  0 0 0  Trouble concentrating 0 0 0  Moving slowly or fidgety/restless 0 0 0  Suicidal thoughts 0 0 0  PHQ-9 Score 0  0  0   Difficult doing work/chores Not difficult at all Not difficult at all Not difficult at all     Data saved with a previous flowsheet row definition      05/14/2024    2:18 PM 03/09/2023    9:55 AM 09/08/2022    8:03 AM 08/08/2022    4:31 PM  GAD 7 : Generalized Anxiety Score  Nervous, Anxious, on Edge 0  0  0  0   Control/stop worrying  0  0  0  0   Worry too much - different things 0  0  0  0   Trouble relaxing 0  0  0  0   Restless 0  0  0  0   Easily annoyed or irritable 0  0  0  0   Afraid - awful might happen 0  0  0  0   Total GAD 7 Score 0 0 0 0  Anxiety Difficulty Not difficult at all Not difficult at all Not difficult at all Not difficult at all     Data saved with a previous flowsheet row definition    No results found for any visits on 08/27/24. {Show previous labs (optional):23779}    Assessment & Plan:    Routine Health Maintenance and Physical Exam  Health Maintenance  Topic Date Due   Colon Cancer Screening  09/07/2024   Flu Shot  10/21/2024*   Pneumococcal Vaccine for age over 37 (1 of 2 - PCV) 03/21/2025*   Hepatitis B Vaccine (1 of 3 - 19+  3-dose series) 03/21/2025*   COVID-19 Vaccine (1 - 2025-26 season) 05/30/2025*   Breast Cancer Screening  07/30/2025   Pap with HPV screening  10/06/2026   HPV Vaccine (No Doses Required) Completed   Meningitis B Vaccine  Aged Out   DTaP/Tdap/Td vaccine  Discontinued   Hepatitis C Screening  Discontinued   HIV Screening  Discontinued   Zoster (Shingles) Vaccine  Discontinued  *Topic was postponed. The date shown is not the original due date.    Discussed health benefits of physical activity, and encouraged her to engage in regular exercise appropriate for her age and condition.  There are no diagnoses linked to this encounter.  No follow-ups on file.     Harlene Saddler, MD     [1]  Outpatient Medications Prior to Visit  Medication Sig   acetaminophen  (TYLENOL ) 500 MG tablet Take 500 mg by mouth every 6 (six) hours as needed for moderate pain (pain score 4-6).   aspirin EC 81 MG tablet Take 81 mg by mouth once.   Cyanocobalamin (VITAMIN B-12 PO) Take by mouth daily. (Patient not taking: Reported on 08/27/2024)   levalbuterol (XOPENEX HFA) 45 MCG/ACT inhaler Inhale 2 puffs into the lungs every 6 (six) hours as needed for wheezing. (Patient not  taking: Reported on 08/27/2024)   No facility-administered medications prior to visit.   "

## 2024-08-27 NOTE — Assessment & Plan Note (Addendum)
 Taking atorvastatin  20 mg  no side effects

## 2024-08-27 NOTE — Assessment & Plan Note (Signed)
 Using albuterol  less than once a month

## 2024-09-08 ENCOUNTER — Ambulatory Visit: Admitting: Sleep Medicine

## 2024-10-20 ENCOUNTER — Ambulatory Visit: Admit: 2024-10-20 | Admitting: Gastroenterology

## 2024-10-20 SURGERY — COLONOSCOPY
Anesthesia: General

## 2025-09-02 ENCOUNTER — Encounter: Admitting: Student
